# Patient Record
Sex: Male | Born: 1975
Health system: Southern US, Community
[De-identification: ages and names within clinical notes are randomized; demographics above are authoritative.]

## PROBLEM LIST (undated history)

## (undated) DIAGNOSIS — M109 Gout, unspecified: Secondary | ICD-10-CM

## (undated) DIAGNOSIS — I10 Essential (primary) hypertension: Secondary | ICD-10-CM

## (undated) DIAGNOSIS — F419 Anxiety disorder, unspecified: Secondary | ICD-10-CM

## (undated) HISTORY — PX: EYE SURGERY: SHX253

## (undated) HISTORY — DX: Anxiety disorder, unspecified: F41.9

## (undated) HISTORY — DX: Essential (primary) hypertension: I10

---

## 2015-12-22 ENCOUNTER — Ambulatory Visit (INDEPENDENT_AMBULATORY_CARE_PROVIDER_SITE_OTHER): Payer: Managed Care, Other (non HMO)

## 2015-12-22 ENCOUNTER — Ambulatory Visit (INDEPENDENT_AMBULATORY_CARE_PROVIDER_SITE_OTHER): Payer: Managed Care, Other (non HMO) | Admitting: Family Medicine

## 2015-12-22 VITALS — BP 130/80 | HR 101 | Temp 98.0°F | Resp 18 | Ht 69.0 in | Wt 200.0 lb

## 2015-12-22 DIAGNOSIS — M10072 Idiopathic gout, left ankle and foot: Secondary | ICD-10-CM

## 2015-12-22 DIAGNOSIS — F1010 Alcohol abuse, uncomplicated: Secondary | ICD-10-CM

## 2015-12-22 DIAGNOSIS — M79672 Pain in left foot: Secondary | ICD-10-CM | POA: Diagnosis not present

## 2015-12-22 DIAGNOSIS — M109 Gout, unspecified: Secondary | ICD-10-CM

## 2015-12-22 LAB — URIC ACID: Uric Acid, Serum: 9 mg/dL — ABNORMAL HIGH (ref 4.0–7.8)

## 2015-12-22 LAB — POCT SEDIMENTATION RATE: POCT SED RATE: 29 mm/hr — AB (ref 0–22)

## 2015-12-22 LAB — POCT CBC
Granulocyte percent: 62.6 %G (ref 37–80)
HEMATOCRIT: 46.4 % (ref 43.5–53.7)
Hemoglobin: 15.9 g/dL (ref 14.1–18.1)
LYMPH, POC: 2.2 (ref 0.6–3.4)
MCH, POC: 30.1 pg (ref 27–31.2)
MCHC: 34.3 g/dL (ref 31.8–35.4)
MCV: 87.9 fL (ref 80–97)
MID (CBC): 0.2 (ref 0–0.9)
MPV: 8.2 fL (ref 0–99.8)
POC GRANULOCYTE: 4.1 (ref 2–6.9)
POC LYMPH %: 33.8 % (ref 10–50)
POC MID %: 3.6 % (ref 0–12)
Platelet Count, POC: 202 10*3/uL (ref 142–424)
RBC: 5.28 M/uL (ref 4.69–6.13)
RDW, POC: 13.1 %
WBC: 6.6 10*3/uL (ref 4.6–10.2)

## 2015-12-22 LAB — GLUCOSE, POCT (MANUAL RESULT ENTRY): POC GLUCOSE: 106 mg/dL — AB (ref 70–99)

## 2015-12-22 MED ORDER — HYDROCODONE-ACETAMINOPHEN 5-325 MG PO TABS: 1.0000 | ORAL_TABLET | Freq: Four times a day (QID) | ORAL | Status: DC | PRN

## 2015-12-22 MED ORDER — PREDNISONE 20 MG PO TABS
ORAL_TABLET | ORAL | Status: DC
Start: 2015-12-22 — End: 2016-01-12

## 2015-12-22 MED ORDER — COLCHICINE 0.6 MG PO TABS: 0.6000 mg | ORAL_TABLET | Freq: Every day | ORAL | Status: DC

## 2015-12-22 NOTE — Patient Instructions (Signed)
Prednisone in the morning - 3 tabs for 3 days, 2 tabs for 3 days, then 1 tab for 3 days. Today take 2 tabs colchicine and then 1 tab 1 hour later. In the future at the first sign of a gout attack, do this. You may take norco as needed for pain. Reduce your alcohol intake and shellfish intake. I will call you with your lab results. Return as needed.  Gout Gout is an inflammatory arthritis caused by a buildup of uric acid crystals in the joints. Uric acid is a chemical that is normally present in the blood. When the level of uric acid in the blood is too high it can form crystals that deposit in your joints and tissues. This causes joint redness, soreness, and swelling (inflammation). Repeat attacks are common. Over time, uric acid crystals can form into masses (tophi) near a joint, destroying bone and causing disfigurement. Gout is treatable and often preventable. CAUSES  The disease begins with elevated levels of uric acid in the blood. Uric acid is produced by your body when it breaks down a naturally found substance called purines. Certain foods you eat, such as meats and fish, contain high amounts of purines. Causes of an elevated uric acid level include:  Being passed down from parent to child (heredity).  Diseases that cause increased uric acid production (such as obesity, psoriasis, and certain cancers).  Excessive alcohol use.  Diet, especially diets rich in meat and seafood.  Medicines, including certain cancer-fighting medicines (chemotherapy), water pills (diuretics), and aspirin.  Chronic kidney disease. The kidneys are no longer able to remove uric acid well.  Problems with metabolism. Conditions strongly associated with gout include:  Obesity.  High blood pressure.  High cholesterol.  Diabetes. Not everyone with elevated uric acid levels gets gout. It is not understood why some people get gout and others do not. Surgery, joint injury, and eating too much of certain  foods are some of the factors that can lead to gout attacks. SYMPTOMS   An attack of gout comes on quickly. It causes intense pain with redness, swelling, and warmth in a joint.  Fever can occur.  Often, only one joint is involved. Certain joints are more commonly involved:  Base of the big toe.  Knee.  Ankle.  Wrist.  Finger. Without treatment, an attack usually goes away in a few days to weeks. Between attacks, you usually will not have symptoms, which is different from many other forms of arthritis. DIAGNOSIS  Your caregiver will suspect gout based on your symptoms and exam. In some cases, tests may be recommended. The tests may include:  Blood tests.  Urine tests.  X-rays.  Joint fluid exam. This exam requires a needle to remove fluid from the joint (arthrocentesis). Using a microscope, gout is confirmed when uric acid crystals are seen in the joint fluid. TREATMENT  There are two phases to gout treatment: treating the sudden onset (acute) attack and preventing attacks (prophylaxis).  Treatment of an Acute Attack.  Medicines are used. These include anti-inflammatory medicines or steroid medicines.  An injection of steroid medicine into the affected joint is sometimes necessary.  The painful joint is rested. Movement can worsen the arthritis.  You may use warm or cold treatments on painful joints, depending which works best for you.  Treatment to Prevent Attacks.  If you suffer from frequent gout attacks, your caregiver may advise preventive medicine. These medicines are started after the acute attack subsides. These medicines either help your  kidneys eliminate uric acid from your body or decrease your uric acid production. You may need to stay on these medicines for a very long time.  The early phase of treatment with preventive medicine can be associated with an increase in acute gout attacks. For this reason, during the first few months of treatment, your caregiver  may also advise you to take medicines usually used for acute gout treatment. Be sure you understand your caregiver's directions. Your caregiver may make several adjustments to your medicine dose before these medicines are effective.  Discuss dietary treatment with your caregiver or dietitian. Alcohol and drinks high in sugar and fructose and foods such as meat, poultry, and seafood can increase uric acid levels. Your caregiver or dietitian can advise you on drinks and foods that should be limited. HOME CARE INSTRUCTIONS   Do not take aspirin to relieve pain. This raises uric acid levels.  Only take over-the-counter or prescription medicines for pain, discomfort, or fever as directed by your caregiver.  Rest the joint as much as possible. When in bed, keep sheets and blankets off painful areas.  Keep the affected joint raised (elevated).  Apply warm or cold treatments to painful joints. Use of warm or cold treatments depends on which works best for you.  Use crutches if the painful joint is in your leg.  Drink enough fluids to keep your urine clear or pale yellow. This helps your body get rid of uric acid. Limit alcohol, sugary drinks, and fructose drinks.  Follow your dietary instructions. Pay careful attention to the amount of protein you eat. Your daily diet should emphasize fruits, vegetables, whole grains, and fat-free or low-fat milk products. Discuss the use of coffee, vitamin C, and cherries with your caregiver or dietitian. These may be helpful in lowering uric acid levels.  Maintain a healthy body weight. SEEK MEDICAL CARE IF:   You develop diarrhea, vomiting, or any side effects from medicines.  You do not feel better in 24 hours, or you are getting worse. SEEK IMMEDIATE MEDICAL CARE IF:   Your joint becomes suddenly more tender, and you have chills or a fever. MAKE SURE YOU:   Understand these instructions.  Will watch your condition.  Will get help right away if you  are not doing well or get worse.   This information is not intended to replace advice given to you by your health care provider. Make sure you discuss any questions you have with your health care provider.   Document Released: 11/29/2000 Document Revised: 12/23/2014 Document Reviewed: 07/15/2012 Elsevier Interactive Patient Education [employer]2016 Elsevier Inc.

## 2015-12-22 NOTE — Progress Notes (Signed)
[facility] [address], [place] [address] [phone]  Date:  12/22/2015   Name:  Lucas PernaSylvester Morse   DOB:  01/17/1976   MRN:  [id number]  PCP:  No primary care provider on file.    Chief Complaint: Foot Pain   History of Present Illness:  This is a 40 y.o. male who is presenting with left foot pain x 1 week. Pain started after getting off work. He thought it had to do with some new boots he was wearing. He had the next couple days off work. He then went back work and made his pain much worse. He has been out of work for the past 3 days but pain isn't getting better. He is limping and walking on his outer foot. He has tried tylenol but not helping much. Denies fever, chills, paresthesias. He does not have a hx of gout. He doesn't eat much shellfish but does have a shrimp fried rice dish once a week. He drinks a 6 pack beer after work every night.  Review of Systems:  Review of Systems See HPI  There are no active problems to display for this patient.   Prior to Admission medications   Not on File    No Known Allergies  Past Surgical History  Procedure Laterality Date  . Eye surgery      Social History  Substance Use Topics  . Smoking status: Current Every Day Smoker  . Smokeless tobacco: None  . Alcohol Use: 0.0 oz/week    0 Standard drinks or equivalent per week    History reviewed. No pertinent family history.  Medication list has been reviewed and updated.  Physical Examination:  Physical Exam  Constitutional: He is oriented to person, place, and time. He appears well-developed and well-nourished. No distress.  HENT:  Head: Normocephalic and atraumatic.  Right Ear: Hearing normal.  Left Ear: Hearing normal.  Nose: Nose normal.  Eyes: Conjunctivae and lids are normal. Right eye exhibits no discharge. Left eye exhibits no discharge. No scleral icterus.  Cardiovascular: Normal rate, regular rhythm, normal heart sounds and normal  pulses.   No murmur heard. Pulmonary/Chest: Effort normal and breath sounds normal. No respiratory distress. He has no wheezes. He has no rhonchi. He has no rales.  Musculoskeletal:       Right ankle: Normal.       Left ankle: Normal.       Right foot: Normal.       Left foot: There is decreased range of motion (due to pain), tenderness (over MTP joint) and swelling. There is normal capillary refill.       Feet:  Erythema and swelling over left MTP joint. Erythema extends over dorsal foot. Warmth present Pedal pulses intact  Neurological: He is alert and oriented to person, place, and time.  Skin: Skin is warm, dry and intact. No lesion and no rash noted.  Psychiatric: He has a normal mood and affect. His speech is normal and behavior is normal. Thought content normal.    BP 130/80 mmHg  Pulse 101  Temp(Src) 98 F (36.7 C) (Oral)  Resp 18  Ht 5\' 9"  (1.753 m)  Wt 200 lb (90.719 kg)  BMI 29.52 kg/m2  SpO2 98%  Results for orders placed or performed in visit on 12/22/15  POCT CBC  Result Value Ref Range   WBC 6.6 4.6 - 10.2 K/uL   Lymph, poc 2.2 0.6 - 3.4   POC LYMPH PERCENT 33.8  10 - 50 %L   MID (cbc) 0.2 0 - 0.9   POC MID % 3.6 0 - 12 %M   POC Granulocyte 4.1 2 - 6.9   Granulocyte percent 62.6 37 - 80 %G   RBC 5.28 4.69 - 6.13 M/uL   Hemoglobin 15.9 14.1 - 18.1 g/dL   HCT, POC 16.1 09.6 - 53.7 %   MCV 87.9 80 - 97 fL   MCH, POC 30.1 27 - 31.2 pg   MCHC 34.3 31.8 - 35.4 g/dL   RDW, POC 04.5 %   Platelet Count, POC 202 142 - 424 K/uL   MPV 8.2 0 - 99.8 fL  POCT glucose (manual entry)  Result Value Ref Range   POC Glucose 106 (A) 70 - 99 mg/dl   [facility] reading (PRIMARY) by  Dr. Conley Rolls: negative.   Assessment and Plan:  1. Acute gout of left foot, unspecified cause 2. Left foot pain 3. Alcohol abuse Suspect gout. Will treat with prednisone taper and colchicine -- gave refills of colchicine to use in the future if needed. Norco prn pain. CBC wnl. Uric acid and sed rate  pending. Counseled on reducing alcohol intake and shellfish intake. He voiced understanding. Return if symptoms not improving in 1 week or at any time if symptoms worsen. - predniSONE (DELTASONE) 20 MG tablet; Take 3 PO QAM x3days, 2 PO QAM x3days, 1 PO QAM x3days  Dispense: 18 tablet; Refill: 0 - HYDROcodone-acetaminophen (NORCO) 5-325 MG tablet; Take 1 tablet by mouth every 6 (six) hours as needed.  Dispense: 30 tablet; Refill: 0 - colchicine 0.6 MG tablet; Take 1 tablet (0.6 mg total) by mouth daily.  Dispense: 3 tablet; Refill: 5 - DG Foot Complete Left; Future - POCT CBC - Uric Acid - POCT SEDIMENTATION RATE - POCT glucose (manual entry)   [place]well Miners. Dyke Brackett, MHS [facility]  12/22/2015

## 2015-12-25 ENCOUNTER — Telehealth: Payer: Self-pay

## 2015-12-25 DIAGNOSIS — M109 Gout, unspecified: Secondary | ICD-10-CM

## 2015-12-25 MED ORDER — COLCHICINE 0.6 MG PO TABS: ORAL_TABLET | ORAL | Status: DC

## 2015-12-25 NOTE — Telephone Encounter (Signed)
I reviewed Lucas Morse's OV notes and copied her instr's on note's plan onto the sig for Rx, "Today take 2 tabs colchicine and then 1 tab 1 hour later. In the future at the first sign of a gout attack, do this.". Called [facility] and explained what directions [name] had intended and re-sent corrected Rx.

## 2015-12-25 NOTE — Telephone Encounter (Signed)
Pharmacist, Selena called for rx clarification on colchicine 0.6 MG tablet /// please call to advise//Pt is at pharmacy now.  [phone]

## 2015-12-26 ENCOUNTER — Telehealth: Payer: Self-pay

## 2015-12-26 NOTE — Telephone Encounter (Signed)
Pt called inq. About lab results

## 2015-12-27 NOTE — Telephone Encounter (Signed)
See labs 

## 2015-12-27 NOTE — Progress Notes (Signed)
Agree with A/P. Dr Le 

## 2016-01-12 ENCOUNTER — Telehealth: Payer: Self-pay

## 2016-01-12 DIAGNOSIS — M109 Gout, unspecified: Secondary | ICD-10-CM

## 2016-01-12 MED ORDER — COLCHICINE 0.6 MG PO TABS: ORAL_TABLET | ORAL | Status: DC

## 2016-01-12 MED ORDER — PREDNISONE 20 MG PO TABS: ORAL_TABLET | ORAL | Status: DC

## 2016-01-12 NOTE — Telephone Encounter (Signed)
Please call to let him know I refilled colchicine , prednisone. Unable to fill hyrdrocodone since needs to be evaluated for that, he needs physcial prescription for narcotics. Marland Kitchen

## 2016-01-12 NOTE — Telephone Encounter (Signed)
Patient is calling to request refills for all three of his medications. Prednisone, hydrocodone, and colchicine. [facility] on [address] [phone]

## 2016-01-12 NOTE — Telephone Encounter (Signed)
Assessment and Plan:  1. Acute gout of left foot, unspecified cause 2. Left foot pain 3. Alcohol abuse Suspect gout. Will treat with prednisone taper and colchicine -- gave refills of colchicine to use in the future if needed. Norco prn pain. CBC wnl. Uric acid and sed rate pending. Counseled on reducing alcohol intake and shellfish intake. He voiced understanding. Return if symptoms not improving in 1 week or at any time if symptoms worsen.

## 2016-07-20 ENCOUNTER — Ambulatory Visit (HOSPITAL_COMMUNITY)
Admission: EM | Admit: 2016-07-20 | Discharge: 2016-07-20 | Disposition: A | Discharge status: Home / Self Care | Payer: Managed Care, Other (non HMO) | Attending: Emergency Medicine | Admitting: Emergency Medicine

## 2016-07-20 ENCOUNTER — Ambulatory Visit: Payer: Managed Care, Other (non HMO)

## 2016-07-20 ENCOUNTER — Encounter (HOSPITAL_COMMUNITY): Payer: Self-pay | Admitting: *Deleted

## 2016-07-20 DIAGNOSIS — M10071 Idiopathic gout, right ankle and foot: Secondary | ICD-10-CM

## 2016-07-20 DIAGNOSIS — M109 Gout, unspecified: Secondary | ICD-10-CM

## 2016-07-20 HISTORY — DX: Gout, unspecified: M10.9

## 2016-07-20 MED ORDER — PREDNISONE 20 MG PO TABS: ORAL_TABLET | ORAL | 0 refills | Status: DC

## 2016-07-20 MED ORDER — COLCHICINE 0.6 MG PO TABS: ORAL_TABLET | ORAL | 0 refills | Status: DC

## 2016-07-20 MED ORDER — HYDROCODONE-ACETAMINOPHEN 5-325 MG PO TABS: 1.0000 | ORAL_TABLET | Freq: Four times a day (QID) | ORAL | 0 refills | Status: DC | PRN

## 2016-07-20 NOTE — ED Provider Notes (Signed)
CSN: [id number]     Arrival date & time 07/20/16  1200 History   None    Chief Complaint  Patient presents with  . Foot Pain   (Consider location/radiation/quality/duration/timing/severity/associated sxs/prior Treatment)  HPI   The patient is a 40 year old male presents today with complaints of right great toe pain. Patient states onset of symptoms at approximately 2 PM yesterday. Patient states she did not recognize that it was a gout flare until this morning at which time he took 2 tablets of colchicine and then an hour later one additional tablet colchicine. He is now out of colchicine and any other medications to treat this flare. Denies any other significant medical history. Denies any medications or allergies to medications at this time. The patient works as an Librarian, academic in a garage.   Past Medical History:  Diagnosis Date  . Anxiety   . Gout    Past Surgical History:  Procedure Laterality Date  . EYE SURGERY     History reviewed. No pertinent family history. Social History  Substance Use Topics  . Smoking status: Current Every Day Smoker  . Smokeless tobacco: Not on file  . Alcohol use 0.0 oz/week    Review of Systems  Constitutional: Negative.  Negative for fatigue and fever.  HENT: Negative.   Eyes: Negative.   Respiratory: Negative.   Cardiovascular: Negative.   Gastrointestinal: Negative.   Endocrine: Negative.   Genitourinary: Negative.   Musculoskeletal: Negative.   Skin: Positive for color change. Negative for rash.       Swelling R great toe joint/painful.   Allergic/Immunologic: Negative.   Neurological: Negative.   Hematological: Negative.   Psychiatric/Behavioral: Negative.     Allergies  Review of patient's allergies indicates no known allergies.  Home Medications   Prior to Admission medications   Medication Sig Start Date End Date Taking? Authorizing Provider  colchicine 0.6 MG tablet Today take 2 tabs colchicine and then 1 tab 1  hour later. In the future at the first sign of a gout attack, do this. 07/20/16   Servando Salina, NP  HYDROcodone-acetaminophen (NORCO) 5-325 MG tablet Take 1 tablet by mouth every 6 (six) hours as needed for severe pain. 07/20/16   Servando Salina, NP  predniSONE (DELTASONE) 20 MG tablet Take 3 PO QAM x3days, 2 PO QAM x3days, 1 PO QAM x3days 07/20/16   Servando Salina, NP   Meds Ordered and Administered this Visit  Medications - No data to display  BP 154/99 (BP Location: Left Arm)   Pulse 96   Temp 98.4 F (36.9 C) (Oral)   Resp 12   SpO2 99%  No data found.   Physical Exam  Constitutional: He appears well-developed and well-nourished. No distress.  Cardiovascular: Normal rate, regular rhythm, normal heart sounds and intact distal pulses.  Exam reveals no gallop and no friction rub.   No murmur heard. Pulmonary/Chest: Effort normal and breath sounds normal. No respiratory distress. He has no wheezes. He has no rales. He exhibits no tenderness.  Skin: Skin is warm and dry. He is not diaphoretic.  Reddened swollen area - proximal joint of R great toe. Tender to touch.  Mild appreciable heat to touch.  CMS intact.  Dorsalis pedal pulses 2+.  Nursing note and vitals reviewed.   Urgent Care Course   Clinical Course    Procedures (including critical care time)  Labs Review Labs Reviewed - No data to display  Imaging Review No results found.  MDM   1. Acute gout of right foot, unspecified cause   2. Acute gout of left foot, unspecified cause    Meds ordered this encounter  Medications  . colchicine 0.6 MG tablet    Sig: Today take 2 tabs colchicine and then 1 tab 1 hour later. In the future at the first sign of a gout attack, do this.    Dispense:  30 tablet    Refill:  0    Order Specific Question:   Supervising Provider    Answer:   Charm Rings [id number]  . HYDROcodone-acetaminophen (NORCO) 5-325 MG tablet    Sig: Take 1 tablet by mouth every 6 (six) hours as  needed for severe pain.    Dispense:  15 tablet    Refill:  0    Order Specific Question:   Supervising Provider    Answer:   Charm Rings [id number]  . DISCONTD: predniSONE (DELTASONE) 20 MG tablet    Sig: Take 3 PO QAM x3days, 2 PO QAM x3days, 1 PO QAM x3days    Dispense:  18 tablet    Refill:  0    Order Specific Question:   Supervising Provider    Answer:   Charm Rings [id number]  . predniSONE (DELTASONE) 20 MG tablet    Sig: Take 3 PO QAM x3days, 2 PO QAM x3days, 1 PO QAM x3days    Dispense:  18 tablet    Refill:  0    Order Specific Question:   Supervising Provider    Answer:   Charm Rings [[id number]]   The usual and customary discharge instructions and warnings were given.  Gave patient additional information on low prurine diet as requested and patient given a work note for Monday if needed to use at his discretion.  The patient verbalizes understanding and agrees to plan of care.       Servando Salina, NP 07/20/16 1255

## 2016-07-20 NOTE — ED Triage Notes (Signed)
PT  REPORTS  PAIN  R  FOOT  SINCE  YESTERDAY  PT  REPORTS  HAS  A HISTORY OF  GOUT  IN PAST        PT  DENYS  ANY SPECEFIC  INJURY    PT AMBULATED  TO  EXAM ROOM

## 2017-11-16 ENCOUNTER — Other Ambulatory Visit: Payer: Self-pay

## 2017-11-16 ENCOUNTER — Encounter (HOSPITAL_COMMUNITY): Payer: Self-pay | Admitting: *Deleted

## 2017-11-16 ENCOUNTER — Ambulatory Visit (HOSPITAL_COMMUNITY)
Admission: EM | Admit: 2017-11-16 | Discharge: 2017-11-16 | Disposition: A | Discharge status: Home / Self Care | Payer: 59

## 2017-11-16 DIAGNOSIS — M109 Gout, unspecified: Secondary | ICD-10-CM | POA: Diagnosis not present

## 2017-11-16 MED ORDER — COLCHICINE 0.6 MG PO TABS: ORAL_TABLET | ORAL | 0 refills | Status: DC

## 2017-11-16 MED ORDER — PREDNISONE 20 MG PO TABS: ORAL_TABLET | ORAL | 0 refills | Status: DC

## 2017-11-16 NOTE — ED Notes (Signed)
Informed Olivia with pt vitals and she verbalized understanding.

## 2017-11-16 NOTE — ED Provider Notes (Signed)
[facility]    CSN: [id number] Arrival date & time: 11/16/17  1202     History   Chief Complaint Chief Complaint  Patient presents with  . Foot Pain    HPI Lucas Morse is a 41 y.o. male.   41 year-old male, with history of white coat HTN, gout, anxiety, presenting today due to left foot pain. States that [place] from [place] Friday and Friday evening, he notice pain to the MTP joint of his left foot. Has had increased pain, redness and swelling since that time. History of gout and states that this feels the same. No fever or chills He states that colchicine and prednisone help him the most     The history is provided by the patient.  Foot Pain  This is a recurrent problem. The current episode started 2 days ago. The problem occurs constantly. The problem has been gradually worsening. Pertinent negatives include no chest pain, no abdominal pain, no headaches and no shortness of breath. Nothing aggravates the symptoms. Nothing relieves the symptoms. He has tried nothing for the symptoms. The treatment provided no relief.    Past Medical History:  Diagnosis Date  . Anxiety   . Gout     There are no active problems to display for this patient.   Past Surgical History:  Procedure Laterality Date  . EYE SURGERY         Home Medications    Prior to Admission medications   Medication Sig Start Date End Date Taking? Authorizing Provider  naproxen sodium (ALEVE) 220 MG tablet Take 220 mg by mouth.   Yes [provider]  colchicine 0.6 MG tablet Today take 2 tabs colchicine and then 1 tab 1 hour later. In the future at the first sign of a gout attack, do this. 11/16/17   Blue, Olivia C, PA-C  HYDROcodone-acetaminophen (NORCO) 5-325 MG tablet Take 1 tablet by mouth every 6 (six) hours as needed for severe pain. 07/20/16   Servando Salinaossi, Catherine H, NP  predniSONE (DELTASONE) 20 MG tablet Take 3 PO QAM x3days, 2 PO QAM x3days, 1 PO QAM x3days  11/16/17   Blue, Marylene Landlivia C, PA-C    Family History History reviewed. No pertinent family history.  Social History Social History   Tobacco Use  . Smoking status: Current Every Day Smoker  . Smokeless tobacco: Never Used  Substance Use Topics  . Alcohol use: Yes    Alcohol/week: 0.0 oz  . Drug use: No     Allergies   Patient has no known allergies.   Review of Systems Review of Systems  Constitutional: Negative for chills and fever.  HENT: Negative for ear pain and sore throat.   Eyes: Negative for pain and visual disturbance.  Respiratory: Negative for cough and shortness of breath.   Cardiovascular: Negative for chest pain and palpitations.  Gastrointestinal: Negative for abdominal pain and vomiting.  Genitourinary: Negative for dysuria and hematuria.  Musculoskeletal: Positive for arthralgias. Negative for back pain.  Skin: Negative for color change and rash.  Neurological: Negative for seizures, syncope and headaches.  All other systems reviewed and are negative.    Physical Exam Triage Vital Signs ED Triage Vitals  Enc Vitals Group     BP 11/16/17 1228 (!) 184/99     Pulse Rate 11/16/17 1228 (!) 101     Resp --      Temp 11/16/17 1225 98.2 F (36.8 C)     Temp Source 11/16/17  1225 Oral     SpO2 11/16/17 1225 100 %     Weight --      Height --      Head Circumference --      Peak Flow --      Pain Score 11/16/17 1222 6     Pain Loc --      Pain Edu? --      Excl. in GC? --    No data found.  Updated Vital Signs BP (!) 184/99 (BP Location: Right Arm)   Pulse (!) 101   Temp 98.2 F (36.8 C) (Oral)   SpO2 100%   Visual Acuity Right Eye Distance:   Left Eye Distance:   Bilateral Distance:    Right Eye Near:   Left Eye Near:    Bilateral Near:     Physical Exam  Constitutional: He appears well-developed and well-nourished.  HENT:  Head: Normocephalic and atraumatic.  Eyes: Conjunctivae are normal.  Neck: Neck supple.  Cardiovascular:  Normal rate and regular rhythm.  No murmur heard. Pulmonary/Chest: Effort normal and breath sounds normal. No respiratory distress.  Abdominal: Soft. There is no tenderness.  Musculoskeletal: He exhibits no edema.       Left foot: There is tenderness and swelling.       Feet:  Erythema and edema noted to the MTP joint of the left great toe. No red streaking. Normal ROM of the joint. Good pedal pulse and cap refill. Nv intact  Neurological: He is alert.  Skin: Skin is warm and dry.  Psychiatric: He has a normal mood and affect.  Nursing note and vitals reviewed.    UC Treatments / Results  Labs (all labs ordered are listed, but only abnormal results are displayed) Labs Reviewed - No data to display  EKG  EKG Interpretation None       Radiology No results found.  Procedures Procedures (including critical care time)  Medications Ordered in UC Medications - No data to display   Initial Impression / Assessment and Plan / UC Course  I have reviewed the triage vital signs and the nursing notes.  Pertinent labs & imaging results that were available during my care of the patient were reviewed by me and considered in my medical decision making (see chart for details).     Exam and history consistent with gout No evidence of septic arthritis Prednisone and colchicine   Final Clinical Impressions(s) / UC Diagnoses   Final diagnoses:  Acute gout of left foot, unspecified cause    ED Discharge Orders        Ordered    colchicine 0.6 MG tablet     11/16/17 1244    predniSONE (DELTASONE) 20 MG tablet     11/16/17 1244       Controlled Substance Prescriptions [place] Controlled Substance Registry consulted? Not Applicable   Alecia LemmingBlue, Olivia C, [place] 11/16/17 1245

## 2017-11-16 NOTE — ED Triage Notes (Signed)
Per pt his left foot started swelling late Friday night. Per pt he feels like it's gout. Per pt he took Aleve.

## 2018-03-20 ENCOUNTER — Ambulatory Visit (HOSPITAL_COMMUNITY)
Admission: EM | Admit: 2018-03-20 | Discharge: 2018-03-20 | Disposition: A | Discharge status: Home / Self Care | Payer: 59 | Attending: Family Medicine | Admitting: Family Medicine

## 2018-03-20 ENCOUNTER — Encounter (HOSPITAL_COMMUNITY): Payer: Self-pay | Admitting: Emergency Medicine

## 2018-03-20 DIAGNOSIS — M109 Gout, unspecified: Secondary | ICD-10-CM

## 2018-03-20 MED ORDER — PREDNISONE 10 MG PO TABS: 50.0000 mg | ORAL_TABLET | Freq: Every day | ORAL | 0 refills | Status: AC

## 2018-03-20 NOTE — ED Provider Notes (Signed)
[facility]    CSN: [id number] Arrival date & time: 03/20/18  1750     History   Chief Complaint Chief Complaint  Patient presents with  . Foot Pain    HPI Lucas Morse is a 42 y.o. male.   42 yo male with PMH significant for gout presents for left great toe pain x 2 days. He has been taking colchicine but this only helps the pain a Triggs. Nothing makes better or worse. He admits to associated toe swelling and erythema.      Past Medical History:  Diagnosis Date  . Anxiety   . Gout     There are no active problems to display for this patient.   Past Surgical History:  Procedure Laterality Date  . EYE SURGERY         Home Medications    Prior to Admission medications   Medication Sig Start Date End Date Taking? Authorizing Provider  colchicine 0.6 MG tablet Today take 2 tabs colchicine and then 1 tab 1 hour later. In the future at the first sign of a gout attack, do this. 11/16/17   Blue, Olivia C, PA-C  HYDROcodone-acetaminophen (NORCO) 5-325 MG tablet Take 1 tablet by mouth every 6 (six) hours as needed for severe pain. 07/20/16   Servando Salinaossi, Catherine H, NP  naproxen sodium (ALEVE) 220 MG tablet Take 220 mg by mouth.    [provider]  predniSONE (DELTASONE) 20 MG tablet Take 3 PO QAM x3days, 2 PO QAM x3days, 1 PO QAM x3days 11/16/17   Blue, Marylene Landlivia C, PA-C    Family History History reviewed. No pertinent family history.  Social History Social History   Tobacco Use  . Smoking status: Current Every Day Smoker  . Smokeless tobacco: Never Used  Substance Use Topics  . Alcohol use: Yes    Alcohol/week: 0.0 oz  . Drug use: No     Allergies   Patient has no known allergies.   Review of Systems Review of Systems  Constitutional: Negative for activity change and appetite change.  HENT: Negative for congestion and ear pain.   Eyes: Negative for discharge and itching.  Respiratory: Negative for apnea and chest tightness.     Cardiovascular: Negative for chest pain and leg swelling.  Gastrointestinal: Negative for abdominal distention and abdominal pain.  Endocrine: Negative for cold intolerance and heat intolerance.  Genitourinary: Negative for difficulty urinating and dysuria.  Musculoskeletal: Negative for arthralgias and back pain.  Neurological: Negative for dizziness and headaches.  Hematological: Negative for adenopathy. Does not bruise/bleed easily.     Physical Exam Triage Vital Signs ED Triage Vitals [03/20/18 1805]  Enc Vitals Group     BP (!) 166/91     Pulse Rate 88     Resp 18     Temp 98.8 F (37.1 C)     Temp Source Oral     SpO2 100 %     Weight      Height      Head Circumference      Peak Flow      Pain Score      Pain Loc      Pain Edu?      Excl. in GC?    No data found.  Updated Vital Signs BP (!) 166/91 (BP Location: Right Arm)   Pulse 88   Temp 98.8 F (37.1 C) (Oral)   Resp 18   SpO2 100%   Visual Acuity Right Eye Distance:  Left Eye Distance:   Bilateral Distance:    Right Eye Near:   Left Eye Near:    Bilateral Near:     Physical Exam  Constitutional: He is oriented to person, place, and time. He appears well-developed and well-nourished.  HENT:  Head: Normocephalic and atraumatic.  Eyes: Pupils are equal, round, and reactive to light. EOM are normal.  Neck: Normal range of motion. Neck supple.  Pulmonary/Chest: No respiratory distress.  Musculoskeletal: Normal range of motion. He exhibits no edema.  Left great toe: swollen, erythematous, and tender to touch  Neurological: He is alert and oriented to person, place, and time.  Skin: Skin is warm and dry.  Psychiatric: He has a normal mood and affect. His behavior is normal.     UC Treatments / Results  Labs (all labs ordered are listed, but only abnormal results are displayed) Labs Reviewed - No data to display  EKG None Radiology No results found.  Procedures Procedures (including  critical care time)  Medications Ordered in UC Medications - No data to display   Initial Impression / Assessment and Plan / UC Course  I have reviewed the triage vital signs and the nursing notes.  Pertinent labs & imaging results that were available during my care of the patient were reviewed by me and considered in my medical decision making (see chart for details).     1. Gout, left great toe- treat with prednisone. Follow up with PCP.  Final Clinical Impressions(s) / UC Diagnoses   Final diagnoses:  None    ED Discharge Orders    None       Controlled Substance Prescriptions [place] Controlled Substance Registry consulted? No   Rolm Bookbinder, DO 03/20/18 1842

## 2018-03-20 NOTE — ED Triage Notes (Signed)
Pt sts left foot pain from gout with hx of same  

## 2018-04-09 ENCOUNTER — Encounter (HOSPITAL_COMMUNITY): Payer: Self-pay | Admitting: Family Medicine

## 2018-04-09 ENCOUNTER — Ambulatory Visit (HOSPITAL_COMMUNITY)
Admission: EM | Admit: 2018-04-09 | Discharge: 2018-04-09 | Disposition: A | Discharge status: Home / Self Care | Payer: 59 | Attending: Internal Medicine | Admitting: Internal Medicine

## 2018-04-09 DIAGNOSIS — M79674 Pain in right toe(s): Secondary | ICD-10-CM | POA: Diagnosis not present

## 2018-04-09 MED ORDER — PREDNISONE 50 MG PO TABS: 50.0000 mg | ORAL_TABLET | Freq: Every day | ORAL | 0 refills | Status: AC

## 2018-04-09 MED ORDER — NAPROXEN 500 MG PO TABS: 500.0000 mg | ORAL_TABLET | Freq: Two times a day (BID) | ORAL | 0 refills | Status: AC

## 2018-04-09 NOTE — Discharge Instructions (Addendum)
As discussed, left toe pain could be due to gout or inflammation.  Start prednisone and naproxen for symptoms.  You can try changing her shoes to see if that is related to your recurrent pain.  Follow-up with your PCP for further evaluation and management needed.  [place] [facility] is another location you can establish PCP care. [place] and [place] locations may have more availability.

## 2018-04-09 NOTE — ED Triage Notes (Signed)
Pt here for continued right foot pain due to Gout. He was seen and treated here a few weeks ago and not getting any better. He has been taking colchicine and prednisone. He says that he has some pain in the left foot.

## 2018-04-09 NOTE — ED Provider Notes (Signed)
[facility]    CSN: [id number] Arrival date & time: 04/09/18  1707     History   Chief Complaint Chief Complaint  Patient presents with  . Foot Pain    HPI Lucas Morse is a 42 y.o. male.   42 year old male comes in for right great toe pain.  He was seen few weeks ago for gout, was given colchicine and prednisone with some relief.  See symptoms resolved prior to current symptom onset.  Current episode started about 2 weeks ago.  He is having pain, swelling.  Denies fever, chills, night sweats.  Denies injury.  Patient has not noticed any obvious trigger to his gout attacks.  Does not currently have a PCP.      Past Medical History:  Diagnosis Date  . Anxiety   . Gout     There are no active problems to display for this patient.   Past Surgical History:  Procedure Laterality Date  . EYE SURGERY         Home Medications    Prior to Admission medications   Medication Sig Start Date End Date Taking? Authorizing Provider  colchicine 0.6 MG tablet Today take 2 tabs colchicine and then 1 tab 1 hour later. In the future at the first sign of a gout attack, do this. 11/16/17   Blue, Olivia C, PA-C  naproxen (NAPROSYN) 500 MG tablet Take 1 tablet (500 mg total) by mouth 2 (two) times daily for 10 days. 04/09/18 04/19/18  Cathie HoopsYu, Amy V, PA-C  predniSONE (DELTASONE) 50 MG tablet Take 1 tablet (50 mg total) by mouth daily for 5 days. 04/09/18 04/14/18  Belinda FisherYu, Amy V, PA-C    Family History History reviewed. No pertinent family history.  Social History Social History   Tobacco Use  . Smoking status: Current Every Day Smoker  . Smokeless tobacco: Never Used  Substance Use Topics  . Alcohol use: Yes    Alcohol/week: 0.0 oz  . Drug use: No     Allergies   Patient has no known allergies.   Review of Systems Review of Systems  Reason unable to perform ROS: See HPI as above.     Physical Exam Triage Vital Signs ED Triage Vitals  Enc Vitals Group     BP  04/09/18 1752 (!) 153/91     Pulse Rate 04/09/18 1752 (!) 104     Resp 04/09/18 1752 18     Temp 04/09/18 1752 98.1 F (36.7 C)     Temp src --      SpO2 04/09/18 1752 100 %     Weight --      Height --      Head Circumference --      Peak Flow --      Pain Score 04/09/18 1751 8     Pain Loc --      Pain Edu? --      Excl. in GC? --    No data found.  Updated Vital Signs BP (!) 153/91   Pulse (!) 104   Temp 98.1 F (36.7 C)   Resp 18   SpO2 100%   Physical Exam  Constitutional: He is oriented to person, place, and time. He appears well-developed and well-nourished. No distress.  HENT:  Head: Normocephalic and atraumatic.  Eyes: Pupils are equal, round, and reactive to light. Conjunctivae are normal.  Musculoskeletal:  Swelling, erythema to the right MTP joint.  No obvious increased warmth.  Tenderness to light touch.  Full range of motion.  Pedal pulse 2+ and equal bilaterally.  Cap refill less than 2 seconds.  Neurological: He is alert and oriented to person, place, and time.     UC Treatments / Results  Labs (all labs ordered are listed, but only abnormal results are displayed) Labs Reviewed - No data to display  EKG None Radiology No results found.  Procedures Procedures (including critical care time)  Medications Ordered in UC Medications - No data to display   Initial Impression / Assessment and Plan / UC Course  I have reviewed the triage vital signs and the nursing notes.  Pertinent labs & imaging results that were available during my care of the patient were reviewed by me and considered in my medical decision making (see chart for details).     Patient with swelling and erythema to the right MTP joint that is consistent with gout.  However, without increased warmth.  Patient also wearing boots that can cause some irritation to the area.  Will treat for gout with prednisone.  Will also have patient take naproxen for pain.  Patient to switch boots to  evaluate if it could be the source of pain.  Patient to follow-up with PCP given frequent gout attacks for further evaluation and management needed.  Resources provided.  Return precautions given.  Patient expresses understanding and agrees to plan.  Final Clinical Impressions(s) / UC Diagnoses   Final diagnoses:  Great toe pain, right    ED Discharge Orders        Ordered    predniSONE (DELTASONE) 50 MG tablet  Daily     04/09/18 1807    naproxen (NAPROSYN) 500 MG tablet  2 times daily     04/09/18 1807        Belinda Fisher, PA-C 04/09/18 1816

## 2018-05-10 ENCOUNTER — Ambulatory Visit (HOSPITAL_COMMUNITY)
Admission: EM | Admit: 2018-05-10 | Discharge: 2018-05-10 | Disposition: A | Discharge status: Home / Self Care | Payer: 59 | Attending: Family Medicine | Admitting: Family Medicine

## 2018-05-10 ENCOUNTER — Encounter (HOSPITAL_COMMUNITY): Payer: Self-pay | Admitting: Emergency Medicine

## 2018-05-10 DIAGNOSIS — M10072 Idiopathic gout, left ankle and foot: Secondary | ICD-10-CM | POA: Diagnosis not present

## 2018-05-10 DIAGNOSIS — M109 Gout, unspecified: Secondary | ICD-10-CM

## 2018-05-10 MED ORDER — COLCHICINE 0.6 MG PO TABS: ORAL_TABLET | ORAL | 5 refills | Status: DC

## 2018-05-10 MED ORDER — PREDNISONE 10 MG (21) PO TBPK: ORAL_TABLET | Freq: Every day | ORAL | 5 refills | Status: DC

## 2018-05-10 NOTE — ED Provider Notes (Signed)
[facility]   [id number] 05/10/18 Arrival Time: 1206   SUBJECTIVE:  Lucas Morse is a 42 y.o. male who presents to the urgent care with complaint of gout pain in his L foot x 2 weeks.   Past Medical History:  Diagnosis Date  . Anxiety   . Gout    History reviewed. No pertinent family history. Social History   Socioeconomic History  . Marital status: Single    Spouse name: Not on file  . Number of children: Not on file  . Years of education: Not on file  . Highest education level: Not on file  Occupational History  . Not on file  Social Needs  . Financial resource strain: Not on file  . Food insecurity:    Worry: Not on file    Inability: Not on file  . Transportation needs:    Medical: Not on file    Non-medical: Not on file  Tobacco Use  . Smoking status: Current Every Day Smoker  . Smokeless tobacco: Never Used  Substance and Sexual Activity  . Alcohol use: Yes    Alcohol/week: 0.0 oz  . Drug use: No  . Sexual activity: Not on file  Lifestyle  . Physical activity:    Days per week: Not on file    Minutes per session: Not on file  . Stress: Not on file  Relationships  . Social connections:    Talks on phone: Not on file    Gets together: Not on file    Attends religious service: Not on file    Active member of club or organization: Not on file    Attends meetings of clubs or organizations: Not on file    Relationship status: Not on file  . Intimate partner violence:    Fear of current or ex partner: Not on file    Emotionally abused: Not on file    Physically abused: Not on file    Forced sexual activity: Not on file  Other Topics Concern  . Not on file  Social History Narrative  . Not on file   No outpatient medications have been marked as taking for the 05/10/18 encounter [facility] Encounter).   No Known Allergies    ROS: As per HPI, remainder of ROS negative.   OBJECTIVE:   Vitals:   05/10/18 1228  BP: (!) 183/116      General appearance: alert; no distress Eyes: PERRL; EOMI; conjunctiva normal HENT: normocephalic; atraumatic; T oral mucosa normal Neck: supple Extremities: Left great toe MTP joint is swollen and quite tender Skin: warm and dry Neurologic: normal gait; grossly normal Psychological: alert and cooperative; normal mood and affect      Labs:  Results for orders placed or performed in visit on 12/22/15  Uric Acid  Result Value Ref Range   Uric Acid, Serum 9.0 (H) 4.0 - 7.8 mg/dL  POCT CBC  Result Value Ref Range   WBC 6.6 4.6 - 10.2 K/uL   Lymph, poc 2.2 0.6 - 3.4   POC LYMPH PERCENT 33.8 10 - 50 %L   MID (cbc) 0.2 0 - 0.9   POC MID % 3.6 0 - 12 %M   POC Granulocyte 4.1 2 - 6.9   Granulocyte percent 62.6 37 - 80 %G   RBC 5.28 4.69 - 6.13 M/uL   Hemoglobin 15.9 14.1 - 18.1 g/dL   HCT, POC 40.9 81.1 - 53.7 %   MCV 87.9 80 - 97 fL   MCH, POC  30.1 27 - 31.2 pg   MCHC 34.3 31.8 - 35.4 g/dL   RDW, POC 16.1 %   Platelet Count, POC 202 142 - 424 K/uL   MPV 8.2 0 - 99.8 fL  POCT SEDIMENTATION RATE  Result Value Ref Range   POCT SED RATE 29 (A) 0 - 22 mm/hr  POCT glucose (manual entry)  Result Value Ref Range   POC Glucose 106 (A) 70 - 99 mg/dl    Labs Reviewed - No data to display  No results found.     ASSESSMENT & PLAN:  1. Acute idiopathic gout involving toe of left foot   2. Acute gout of left foot, unspecified cause     Meds ordered this encounter  Medications  . colchicine 0.6 MG tablet    Sig: Today take 2 tabs colchicine and then 1 tab 1 hour later. In the future at the first sign of a gout attack, do this.    Dispense:  30 tablet    Refill:  5  . predniSONE (STERAPRED UNI-PAK 21 TAB) 10 MG (21) TBPK tablet    Sig: Take by mouth daily. Take 6 tabs by mouth daily  for 2 days, then 5 tabs for 2 days, then 4 tabs for 2 days, then 3 tabs for 2 days, 2 tabs for 2 days, then 1 tab by mouth daily for 2 days    Dispense:  42 tablet    Refill:  5     Reviewed expectations re: course of current medical issues. Questions answered. Outlined signs and symptoms indicating need for more acute intervention. Patient verbalized understanding. After Visit Summary given.    Procedures:      Elvina Sidle, MD 05/10/18 [phone]

## 2018-05-10 NOTE — ED Triage Notes (Signed)
Pt c/o gout pain in his L foot x2 weeks.

## 2020-01-03 ENCOUNTER — Ambulatory Visit (HOSPITAL_COMMUNITY)
Admission: EM | Admit: 2020-01-03 | Discharge: 2020-01-03 | Disposition: A | Discharge status: Home / Self Care | Payer: Self-pay | Attending: Family Medicine | Admitting: Family Medicine

## 2020-01-03 ENCOUNTER — Encounter (HOSPITAL_COMMUNITY): Payer: Self-pay

## 2020-01-03 ENCOUNTER — Other Ambulatory Visit: Payer: Self-pay

## 2020-01-03 DIAGNOSIS — R030 Elevated blood-pressure reading, without diagnosis of hypertension: Secondary | ICD-10-CM

## 2020-01-03 DIAGNOSIS — M109 Gout, unspecified: Secondary | ICD-10-CM

## 2020-01-03 MED ORDER — COLCHICINE 0.6 MG PO TABS: ORAL_TABLET | ORAL | 0 refills | Status: DC

## 2020-01-03 MED ORDER — PREDNISONE 10 MG (21) PO TBPK: ORAL_TABLET | Freq: Every day | ORAL | 5 refills | Status: DC

## 2020-01-03 NOTE — ED Triage Notes (Signed)
Pt presents with recurrent gout flare up.

## 2020-01-05 NOTE — ED Provider Notes (Signed)
[facility]   [id number] 01/03/20 Arrival Time: 1132  ASSESSMENT & PLAN:  1. Podagra   2. Elevated blood pressure reading without diagnosis of hypertension     No indication for foot imaging. Will treat for gout.  Reports below medications have helped in the past. Keeps colchicine on hand for prn use.  Begin: Meds ordered this encounter  Medications  . predniSONE (STERAPRED UNI-PAK 21 TAB) 10 MG (21) TBPK tablet    Sig: Take by mouth daily. Take 6 tabs by mouth daily  for 2 days, then 5 tabs for 2 days, then 4 tabs for 2 days, then 3 tabs for 2 days, 2 tabs for 2 days, then 1 tab by mouth daily for 2 days    Dispense:  42 tablet    Refill:  5  . colchicine 0.6 MG tablet    Sig: Today take 2 tabs colchicine and then 1 tab 1 hour later. In the future at the first sign of a gout attack, do this.    Dispense:  30 tablet    Refill:  0    Encouraged follow up with PCP or establishment of care with a PCP to recheck BP. Has been told of high readings in the past. May return here to recheck BP at any time. Also recommended several lifestyle modifications (weight loss, dietary sodium restriction, increase physical activity and moderate alcohol consumption).    Recommend: Follow-up Information    Schedule an appointment as soon as possible for a visit  with Primary Care at [facility].   Specialty: Family Medicine Contact information: [address], [address] [place] [address] [phone]       Schedule an appointment as soon as possible for a visit  with [facility].   Contact information: [address] [place] [address] [phone]           Reviewed expectations re: course of current medical issues. Questions answered. Outlined signs and symptoms indicating need for more acute intervention. Patient verbalized understanding. After Visit Summary given.  SUBJECTIVE: History from:  patient. Lucas Morse is a 44 y.o. male who reports persistent marked pain of his left great toe/foot; described as aching; without radiation. Onset: gradual. First noted: few d ago. Injury/trama: no. Symptoms have progressed to a point and plateaued since beginning. Aggravating factors: weight bearing. Alleviating factors: have not been identified. Associated symptoms: none reported. Extremity sensation changes or weakness: none. Self treatment: acetaminophen, with no relief.  History of similar: yes, "feels like gout I've had in the past".  Past Surgical History:  Procedure Laterality Date  . EYE SURGERY      Increased blood pressure noted today. Reports that he has not been treated for hypertension in the past.  He reports no chest pain on exertion, no dyspnea on exertion, no swelling of ankles, no orthostatic dizziness or lightheadedness, no orthopnea or paroxysmal nocturnal dyspnea, no palpitations and no intermittent claudication symptoms.  ROS: As per HPI. All other systems negative.    OBJECTIVE:  Vitals:   01/03/20 1221  BP: (!) 179/118  Pulse: 89  Resp: 18  Temp: 98.6 F (37 C)  TempSrc: Oral  SpO2: 99%    General appearance: alert; no distress HEENT: [place]; AT Neck: supple with FROM Resp: unlabored respirations Extremities: . LLE: tender over L first MTP joint; warm with well perfused appearance; mild swelling and erythema CV: brisk extremity capillary refill of LLE; 2+ DP pulse of LLE.  Skin: warm and dry; no visible rashes Neurologic: gait normal; normal sensation and strength of LLE Psychological: alert and cooperative; normal mood and affect    No Known Allergies  Past Medical History:  Diagnosis Date  . Anxiety   . Gout    Social History   Socioeconomic History  . Marital status: Single    Spouse name: Not on file  . Number of children: Not on file  . Years of education: Not on file  . Highest education level: Not on file  Occupational  History  . Not on file  Tobacco Use  . Smoking status: Current Every Day Smoker  . Smokeless tobacco: Never Used  Substance and Sexual Activity  . Alcohol use: Yes    Alcohol/week: 0.0 standard drinks  . Drug use: No  . Sexual activity: Not on file  Other Topics Concern  . Not on file  Social History Narrative  . Not on file   Social Determinants of Health   Financial Resource Strain:   . Difficulty of Paying Living Expenses: Not on file  Food Insecurity:   . Worried About Charity fundraiser in the Last Year: Not on file  . Ran Out of Food in the Last Year: Not on file  Transportation Needs:   . Lack of Transportation (Medical): Not on file  . Lack of Transportation (Non-Medical): Not on file  Physical Activity:   . Days of Exercise per Week: Not on file  . Minutes of Exercise per Session: Not on file  Stress:   . Feeling of Stress : Not on file  Social Connections:   . Frequency of Communication with Friends and Family: Not on file  . Frequency of Social Gatherings with Friends and Family: Not on file  . Attends Religious Services: Not on file  . Active Member of Clubs or Organizations: Not on file  . Attends Archivist Meetings: Not on file  . Marital Status: Not on file   History reviewed. No pertinent family history. Past Surgical History:  Procedure Laterality Date  . EYE SURGERY        Vanessa Kick, MD 01/05/20 [phone]

## 2020-07-02 ENCOUNTER — Encounter (HOSPITAL_COMMUNITY): Payer: Self-pay

## 2020-07-02 ENCOUNTER — Other Ambulatory Visit: Payer: Self-pay

## 2020-07-02 ENCOUNTER — Ambulatory Visit (HOSPITAL_COMMUNITY)
Admission: EM | Admit: 2020-07-02 | Discharge: 2020-07-02 | Disposition: A | Discharge status: Home / Self Care | Payer: Self-pay | Attending: Urgent Care | Admitting: Urgent Care

## 2020-07-02 DIAGNOSIS — M25562 Pain in left knee: Secondary | ICD-10-CM

## 2020-07-02 DIAGNOSIS — M109 Gout, unspecified: Secondary | ICD-10-CM

## 2020-07-02 MED ORDER — PREDNISONE 20 MG PO TABS: ORAL_TABLET | ORAL | 0 refills | Status: DC

## 2020-07-02 MED ORDER — COLCHICINE 0.6 MG PO TABS: 0.6000 mg | ORAL_TABLET | Freq: Two times a day (BID) | ORAL | 0 refills | Status: DC

## 2020-07-02 NOTE — ED Provider Notes (Signed)
[facility]   MRN: [id number] DOB: [birth year]  Subjective:   Lucas Morse is a 44 y.o. male with pmh of gout presenting for 1 week hx of recurrent left knee pain. Last uric acid level was in 2017, was 9.0.  He had actually gotten refills on a prednisone pack from his last office visit in January.  States that he used this and still has a couple days left but has only helped him a [place] bit.  Admits that he has been eating very healthily, drinking a sixpack of beer 1-2 times per day week.  He eats very [place] fiber and has been eating fried food, red meat, liver.  Denies falls, trauma.  Has continued to work, does a lot of walking, bending, squatting.  Has previously had good success with colchicine as well.  Does not have a PCP.  No current facility-administered medications for this encounter.  Current Outpatient Medications:  .  colchicine 0.6 MG tablet, Today take 2 tabs colchicine and then 1 tab 1 hour later. In the future at the first sign of a gout attack, do this., Disp: 30 tablet, Rfl: 0 .  predniSONE (STERAPRED UNI-PAK 21 TAB) 10 MG (21) TBPK tablet, Take by mouth daily. Take 6 tabs by mouth daily  for 2 days, then 5 tabs for 2 days, then 4 tabs for 2 days, then 3 tabs for 2 days, 2 tabs for 2 days, then 1 tab by mouth daily for 2 days, Disp: 42 tablet, Rfl: 5   No Known Allergies  Past Medical History:  Diagnosis Date  . Anxiety   . Gout      Past Surgical History:  Procedure Laterality Date  . EYE SURGERY      History reviewed. No pertinent family history.  Social History   Tobacco Use  . Smoking status: Current Every Day Smoker  . Smokeless tobacco: Never Used  Substance Use Topics  . Alcohol use: Yes    Alcohol/week: 0.0 standard drinks  . Drug use: No    ROS   Objective:   Vitals: Pulse 100   Temp 98.4 F (36.9 C) (Oral)   Resp (!) 22   SpO2 100%   Physical Exam Constitutional:      General: He is not in acute distress.    Appearance:  Normal appearance. He is well-developed and normal weight. He is not ill-appearing, toxic-appearing or diaphoretic.  HENT:     Head: Normocephalic and atraumatic.     Right Ear: External ear normal.     Left Ear: External ear normal.     Nose: Nose normal.     Mouth/Throat:     Pharynx: Oropharynx is clear.  Eyes:     General: No scleral icterus.       Right eye: No discharge.        Left eye: No discharge.     Extraocular Movements: Extraocular movements intact.     Pupils: Pupils are equal, round, and reactive to light.  Cardiovascular:     Rate and Rhythm: Normal rate.  Pulmonary:     Effort: Pulmonary effort is normal.  Musculoskeletal:     Cervical back: Normal range of motion.     Left knee: Swelling and erythema present. Normal range of motion. Tenderness (superior knee with associated warmth and erythema) present.  Skin:    General: Skin is warm and dry.  Neurological:     Mental Status: He is alert and oriented to person, place, and time.  Comments: Ambulates without assistance albeit with pain in his left knee.  Psychiatric:        Mood and Affect: Mood normal.        Behavior: Behavior normal.        Thought Content: Thought content normal.        Judgment: Judgment normal.     Assessment and Plan :   PDMP not reviewed this encounter.  1. Acute pain of left knee   2. Acute gout of left knee, unspecified cause     Emphasized need for significant dietary modifications, low purine diet.  Recommended holding off on using the previous steroid taper and will have him use prednisone 40 mg x 5 days.  Offered patient colchicine to use with this as well, can use this twice daily.  Denies history of kidney disease.  Recommended patient set up follow-up with a new primary care provider, information given to them.   Wallis Bamberg, PA-C 07/02/20 1457

## 2020-07-02 NOTE — ED Triage Notes (Addendum)
Pt presents with left knee pain x 1 week. Worsens when bending the knee or walking for a extended period of time.  States he always get 3 medications for the knee pain and last time they prescribed just 1 and did not helped with the pain.

## 2020-07-19 ENCOUNTER — Ambulatory Visit (HOSPITAL_COMMUNITY)
Admission: EM | Admit: 2020-07-19 | Discharge: 2020-07-19 | Disposition: A | Discharge status: Home / Self Care | Payer: Self-pay | Attending: Family Medicine | Admitting: Family Medicine

## 2020-07-19 ENCOUNTER — Encounter (HOSPITAL_COMMUNITY): Payer: Self-pay | Admitting: Emergency Medicine

## 2020-07-19 ENCOUNTER — Other Ambulatory Visit: Payer: Self-pay

## 2020-07-19 DIAGNOSIS — M109 Gout, unspecified: Secondary | ICD-10-CM

## 2020-07-19 DIAGNOSIS — R030 Elevated blood-pressure reading, without diagnosis of hypertension: Secondary | ICD-10-CM

## 2020-07-19 MED ORDER — COLCHICINE 0.6 MG PO TABS: ORAL_TABLET | ORAL | 0 refills | Status: DC

## 2020-07-19 NOTE — Discharge Instructions (Signed)
Your blood pressure was noted to be elevated during your visit today. If you are currently taking medication for high blood pressure, please ensure you are taking this as directed. If you do not have a history of high blood pressure and your blood pressure remains persistently elevated, you may need to begin taking a medication at some point. You may return here within the next few days to recheck if unable to see your primary care provider or if do not have a one.  BP (!) 184/104 (BP Location: Left Arm)   Pulse 98   Temp 98.4 F (36.9 C) (Oral)   Resp 16   SpO2 99%

## 2020-07-19 NOTE — ED Triage Notes (Signed)
Pt presents with right foot pain. States he has a hx of gout. Was seen 07/02/20 at [facility].

## 2020-07-20 ENCOUNTER — Telehealth: Payer: Self-pay

## 2020-07-20 ENCOUNTER — Telehealth (HOSPITAL_COMMUNITY): Payer: Self-pay | Admitting: Family Medicine

## 2020-07-20 MED ORDER — PREDNISONE 10 MG (21) PO TBPK
ORAL_TABLET | Freq: Every day | ORAL | 0 refills | Status: DC
Start: 2020-07-20 — End: 2020-08-03

## 2020-07-20 NOTE — Telephone Encounter (Signed)
Pt requested prednisone be send to the pharmacy, as Dr, Tracie Harrier told him if his symptoms  do not improved.   Dr. Tracie Harrier sent the prescription to the pharmacy

## 2020-07-20 NOTE — ED Provider Notes (Signed)
[facility]   [id number] 07/19/20 Arrival Time: 1424  ASSESSMENT & PLAN:  1. Podagra   2. Elevated blood pressure reading without diagnosis of hypertension       Discharge Instructions     Your blood pressure was noted to be elevated during your visit today. If you are currently taking medication for high blood pressure, please ensure you are taking this as directed. If you do not have a history of high blood pressure and your blood pressure remains persistently elevated, you may need to begin taking a medication at some point. You may return here within the next few days to recheck if unable to see your primary care provider or if do not have a one.  BP (!) 184/104 (BP Location: Left Arm)   Pulse 98   Temp 98.4 F (36.9 C) (Oral)   Resp 16   SpO2 99%        Begin: Meds ordered this encounter  Medications  . colchicine 0.6 MG tablet    Sig: Take two tablets as one dose followed one hour later by one tablet.    Dispense:  30 tablet    Refill:  0      Reviewed expectations re: course of current medical issues. Questions answered. Outlined signs and symptoms indicating need for more acute intervention. Patient verbalized understanding. After Visit Summary given.  SUBJECTIVE: History from: patient. Lucas Morse is a 44 y.o. male who reports R great toe pain; h/o gout; feels same. No trauma. Pain few days. Able to bear weight but with pain. No recent illnesses.  Increased blood pressure noted today. Reports that he has not been treated for hypertension in the past.  He reports no chest pain on exertion, no dyspnea on exertion, no swelling of ankles, no orthostatic dizziness or lightheadedness, no orthopnea or paroxysmal nocturnal dyspnea, no palpitations and no intermittent claudication symptoms.   Past Surgical History:  Procedure Laterality Date  . EYE SURGERY        OBJECTIVE:  Vitals:   07/19/20 1547 07/19/20 1624  BP: (!) 224/118 (!)  184/104  Pulse: 98   Resp: 16   Temp: 98.4 F (36.9 C)   TempSrc: Oral   SpO2: 99%     General appearance: alert; no distress HEENT: [place]; AT Neck: supple with FROM Resp: unlabored respirations Extremities: . RLE: pain over first MTP joint; mild swelling Skin: warm and dry; no visible rashes Neurologic: gait normal Psychological: alert and cooperative; normal mood and affect    No Known Allergies  Past Medical History:  Diagnosis Date  . Anxiety   . Gout    Social History   Socioeconomic History  . Marital status: Single    Spouse name: Not on file  . Number of children: Not on file  . Years of education: Not on file  . Highest education level: Not on file  Occupational History  . Not on file  Tobacco Use  . Smoking status: Current Every Day Smoker  . Smokeless tobacco: Never Used  Substance and Sexual Activity  . Alcohol use: Yes    Alcohol/week: 0.0 standard drinks  . Drug use: No  . Sexual activity: Not on file  Other Topics Concern  . Not on file  Social History Narrative  . Not on file   Social Determinants of Health   Financial Resource Strain:   . Difficulty of Paying Living Expenses:   Food Insecurity:   . Worried About Programme researcher, broadcasting/film/video in  the Last Year:   . Ran Out of Food in the Last Year:   Transportation Needs:   . Freight forwarder (Medical):   Marland Kitchen Lack of Transportation (Non-Medical):   Physical Activity:   . Days of Exercise per Week:   . Minutes of Exercise per Session:   Stress:   . Feeling of Stress :   Social Connections:   . Frequency of Communication with Friends and Family:   . Frequency of Social Gatherings with Friends and Family:   . Attends Religious Services:   . Active Member of Clubs or Organizations:   . Attends Banker Meetings:   Marland Kitchen Marital Status:    History reviewed. No pertinent family history. Past Surgical History:  Procedure Laterality Date  . EYE SURGERY        Mardella Layman,  MD 07/20/20 [phone]

## 2020-07-20 NOTE — Telephone Encounter (Signed)
Requesting prednisone to help with dx gout flare. Sent to his pharmacy.

## 2020-08-03 ENCOUNTER — Encounter (HOSPITAL_COMMUNITY): Payer: Self-pay

## 2020-08-03 ENCOUNTER — Ambulatory Visit (HOSPITAL_COMMUNITY)
Admission: EM | Admit: 2020-08-03 | Discharge: 2020-08-03 | Disposition: A | Discharge status: Home / Self Care | Payer: Self-pay | Attending: Urgent Care | Admitting: Urgent Care

## 2020-08-03 ENCOUNTER — Other Ambulatory Visit: Payer: Self-pay

## 2020-08-03 DIAGNOSIS — I10 Essential (primary) hypertension: Secondary | ICD-10-CM

## 2020-08-03 DIAGNOSIS — M79672 Pain in left foot: Secondary | ICD-10-CM

## 2020-08-03 DIAGNOSIS — M109 Gout, unspecified: Secondary | ICD-10-CM

## 2020-08-03 MED ORDER — AMLODIPINE BESYLATE 5 MG PO TABS: 5.0000 mg | ORAL_TABLET | Freq: Every day | ORAL | 0 refills | Status: DC

## 2020-08-03 MED ORDER — PREDNISONE 20 MG PO TABS: ORAL_TABLET | ORAL | 0 refills | Status: DC

## 2020-08-03 NOTE — Discharge Instructions (Signed)
For your blood pressure start taking one tablet of amlodipine 5mg  once daily. After 1 week, start taking 10mg  (two tablets) daily. Follow up with [facility] to establish care and set up a new PCP.

## 2020-08-03 NOTE — ED Provider Notes (Addendum)
[facility]   MRN: [id number] DOB: 17-Oct-1976  Subjective:   Lucas Morse is a 44 y.o. male presenting for 2-3 day hx of acute onset recurrent gout of his left great toe. Patient has tried to make dietary modifications. He just moved here and therefore does not have a PCP. He used to take bp meds but does not have any now since moving here.   No current facility-administered medications for this encounter.  Current Outpatient Medications:  .  colchicine 0.6 MG tablet, Take two tablets as one dose followed one hour later by one tablet., Disp: 30 tablet, Rfl: 0   No Known Allergies  Past Medical History:  Diagnosis Date  . Anxiety   . Gout      Past Surgical History:  Procedure Laterality Date  . EYE SURGERY      History reviewed. No pertinent family history.  Social History   Tobacco Use  . Smoking status: Current Every Day Smoker  . Smokeless tobacco: Never Used  Substance Use Topics  . Alcohol use: Yes    Alcohol/week: 0.0 standard drinks  . Drug use: No    ROS Denies cp, ha, weakness, belly pain, hematuria, numbness or tingling.   Objective:   Vitals: BP (!) 212/133   Pulse (!) 102   Temp 98.2 F (36.8 C)   Resp 16   SpO2 100%   Wt Readings from Last 3 Encounters:  12/22/15 200 lb (90.7 kg)   Temp Readings from Last 3 Encounters:  08/03/20 98.2 F (36.8 C)  07/19/20 98.4 F (36.9 C) (Oral)  07/02/20 98.4 F (36.9 C) (Oral)   BP Readings from Last 3 Encounters:  08/03/20 (!) 212/133  07/19/20 (!) 184/104  07/02/20 (!) 169/89   Pulse Readings from Last 3 Encounters:  08/03/20 (!) 102  07/19/20 98  07/02/20 100   BP recheck was 181/134.  Physical Exam Constitutional:      General: He is not in acute distress.    Appearance: Normal appearance. He is well-developed. He is not ill-appearing, toxic-appearing or diaphoretic.  HENT:     Head: Normocephalic and atraumatic.     Right Ear: External ear normal.     Left Ear:  External ear normal.     Nose: Nose normal.     Mouth/Throat:     Mouth: Mucous membranes are moist.     Pharynx: Oropharynx is clear.  Eyes:     General: No scleral icterus.    Extraocular Movements: Extraocular movements intact.     Pupils: Pupils are equal, round, and reactive to light.  Cardiovascular:     Rate and Rhythm: Normal rate and regular rhythm.     Heart sounds: Normal heart sounds. No murmur heard.  No friction rub. No gallop.   Pulmonary:     Effort: Pulmonary effort is normal. No respiratory distress.     Breath sounds: Normal breath sounds. No stridor. No wheezing, rhonchi or rales.  Musculoskeletal:     Right lower leg: No edema.     Left lower leg: No edema.     Comments: Exquisite tenderness with erythema of left 1st MTP.   Skin:    General: Skin is warm and dry.  Neurological:     Mental Status: He is alert and oriented to person, place, and time.     Cranial Nerves: No cranial nerve deficit.     Motor: No weakness.     Coordination: Coordination normal.     Gait: Gait  normal.     Deep Tendon Reflexes: Reflexes normal.     Comments: Negative Romberg and pronator drift.   Psychiatric:        Mood and Affect: Mood normal.        Behavior: Behavior normal.        Thought Content: Thought content normal.        Judgment: Judgment normal.     Assessment and Plan :   PDMP not reviewed this encounter.  1. Acute gout involving toe of left foot, unspecified cause   2. Foot pain, left   3. Essential hypertension   4. Elevated blood pressure reading in office with diagnosis of hypertension     Severely elevated bp, needs to start amlodipine, dosing instructions reviewed. Contact [facility] for establishing care with a new PCP. Start prednisone for gout flare. Use colchicine again thereafter as needed. Counseled patient on potential for adverse effects with medications prescribed/recommended today, strict ER and return-to-clinic precautions discussed,  patient verbalized understanding.   Wallis Bamberg, PA-C 08/03/20 1439

## 2020-08-03 NOTE — ED Triage Notes (Signed)
Pt c/o left big toe pain, hx of gout

## 2020-08-14 ENCOUNTER — Other Ambulatory Visit: Payer: Self-pay

## 2020-08-14 ENCOUNTER — Encounter: Payer: Self-pay | Admitting: Student

## 2020-08-14 ENCOUNTER — Ambulatory Visit (INDEPENDENT_AMBULATORY_CARE_PROVIDER_SITE_OTHER): Payer: Self-pay | Admitting: Student

## 2020-08-14 DIAGNOSIS — I10 Essential (primary) hypertension: Secondary | ICD-10-CM

## 2020-08-14 DIAGNOSIS — Z0000 Encounter for general adult medical examination without abnormal findings: Secondary | ICD-10-CM | POA: Insufficient documentation

## 2020-08-14 DIAGNOSIS — M109 Gout, unspecified: Secondary | ICD-10-CM

## 2020-08-14 DIAGNOSIS — Z0001 Encounter for general adult medical examination with abnormal findings: Secondary | ICD-10-CM

## 2020-08-14 MED ORDER — ALLOPURINOL 100 MG PO TABS: 100.0000 mg | ORAL_TABLET | Freq: Every day | ORAL | 2 refills | Status: DC

## 2020-08-14 MED ORDER — AMLODIPINE BESYLATE 10 MG PO TABS: 10.0000 mg | ORAL_TABLET | Freq: Every day | ORAL | 11 refills | Status: DC

## 2020-08-14 MED ORDER — LOSARTAN POTASSIUM 25 MG PO TABS: 25.0000 mg | ORAL_TABLET | Freq: Every day | ORAL | 11 refills | Status: DC

## 2020-08-14 NOTE — Assessment & Plan Note (Addendum)
History of hypertension. Recently moved and was without BP meds. At recent urgent care visit, BP found to be 212/133 however patient was asymptomatic. Started on amlodipine 5mg  daily.  Reports that he was taking amlodipine 5mg  for one week and then increased to 5mg  BID as of last Friday. BP today 176/86 so will require additional antihypertensive medication. Patient asymptomatic at this time. Advised to incorporate DASH diet and exercise into daily life, which patient agrees with.   He does reports smoking cigarettes (10 pack-year history), but states that he is trying to cut down. Now smoking half-a-pack per day. Counseled on smoking cessation but patient states that he will try to keep cutting down on it. Not amenable to completely stopping at this time.   Will start him on losartan 25mg  for further BP control.  Plan: -Continue amlodipine 10mg  daily -Start losartan 25mg  daily -DASH diet and exercise -Continue smoking cessation counseling -Avoid thiazides due to history of gout

## 2020-08-14 NOTE — Assessment & Plan Note (Addendum)
Patient with history of gout on colchicine 0.6mg  twice daily. Recent ED visit for acute gout flare of L great toe. Given 10-day course of prednisone 40mg  (with last dose on 08/13/20). Stopped taking colchicine because it was not helping and was causing diarrhea.  He reports that gout flares are typically at base of L great toe with one isolated episode a year ago in R great toe. Has gone to Urgent Care 5 times this past year due to gout flares. Reports that he has been trying to improve his diet but does still eat red meat at times. Does report drinking about 6 beers/week. Counseled on improving diet and minimizing alcohol intake to help reduce gout flares.  He reports that he only has relief with prednisone 20mg  tablets for gout flares. Advised him that long-term steroid therapy has many adverse effects so it is not recommended for chronic therapy. Advised him to call clinic if he has acute gout flare so that we can help manage it.  Will start Allopurinol 100mg  daily at this time. Advised to take OTC Aleve BID for the first couple of weeks while starting allopurinol to help reduce gout flares during this time. Patient refusing blood draw at this time so unable to check uric acid level and BMP for renal function at this time. Sent home with pamphlet with low purine diet.  Plan: -discontinued colchicine -Start allopurinol 100mg  daily -take naproxen BID for first couple of weeks of allopurinol initiation -Check uric acid and BMP at next visit -Optimize diet and minimize alcohol intake -to meet with financial counselor for orange card

## 2020-08-14 NOTE — Assessment & Plan Note (Signed)
Patient requires Hep C screening, HIV screening, and Tetanus/Tdap vaccine. However, patient is uninsured at this time so will wait until next visit. Scheduled to see financial counselor to obtain orange card.  He reports having obtained his first dose of the COVID vaccine last week and is scheduled for his second dose in two weeks.

## 2020-08-14 NOTE — Progress Notes (Signed)
   CC: Establish PCP  HPI:  [name] is a 44 y.o. Male with history of gout and essential hypertension who presented to the clinic to establish PCP and for management of gout and HTN. Please see individualized A/P for further HPI.  Past Medical History:  Diagnosis Date  . Anxiety   . Gout    Medications: - Amlodipine 10mg  daily - Allopurinol 100mg  daily - Losartan 25mg  daily  Allergies: NKDA  Past Surgical History: Right eye surgery when he was a kid (reason not known).  Family History: - Mother: deceased d/t HIV/AIDS (as per patient) - Maternal Uncle - Gout - paternal grandmother - stomach cancer  Social History: - Drinks alcohol (about 6 beers/week) - Smokes cigarettes (10 pack-year history) --> has cut down to half-a-pack/day - No recreational drug use - Works as man   Review of Systems:  Negative ROS aside from that listed in individualized A/P.  Physical Exam:  Vitals:   08/14/20 1451  BP: (!) 176/86  Pulse: 99  SpO2: 100%  Weight: 217 lb 14.4 oz (98.8 kg)   Physical Exam Constitutional:      Appearance: Normal appearance.  HENT:     Head: Normocephalic and atraumatic.     Mouth/Throat:     Mouth: Mucous membranes are moist.     Pharynx: Oropharynx is clear. No oropharyngeal exudate.  Eyes:     Extraocular Movements: Extraocular movements intact.     Conjunctiva/sclera: Conjunctivae normal.     Pupils: Pupils are equal, round, and reactive to light.  Cardiovascular:     Rate and Rhythm: Normal rate and regular rhythm.     Heart sounds: Normal heart sounds. No murmur heard.  No friction rub. No gallop.   Pulmonary:     Effort: Pulmonary effort is normal.     Breath sounds: No wheezing, rhonchi or rales.  Abdominal:     General: Bowel sounds are normal. There is no distension.     Palpations: Abdomen is soft.     Tenderness: There is no abdominal tenderness. There is no guarding or rebound.  Musculoskeletal:         General: No swelling. Normal range of motion.     Comments: Mild discomfort at base of left great toe at usual site of gout flares. No redness or erythema.  Skin:    General: Skin is warm and dry.  Neurological:     General: No focal deficit present.     Mental Status: He is alert and oriented to person, place, and time.  Psychiatric:        Mood and Affect: Mood normal.        Behavior: Behavior normal.        Thought Content: Thought content normal.     Assessment & Plan:   See Encounters Tab for problem based charting.  Patient seen with Dr. 

## 2020-08-14 NOTE — Patient Instructions (Addendum)
Lucas Morse,  As we discussed, we will be starting you on a new medication for your gout. The medication is called Allopurinol. Please take one tablet daily. We also want you to take the Aleve twice a day (one in the morning and one at night) for the next two weeks while you start the Allopurinol.   If you have an flare-up of your gout, please make sure to call us so that we can help treat it.  For your high blood pressure, we want you to continue taking the Amlodipine 10mg  daily. We are also starting you on Losartan 25mg  to better control your high blood pressure.  Please make sure to meet our financial counselor to help you get a temporary orange card to help with medical expenses.   Low-Purine Eating Plan A low-purine eating plan involves making food choices to limit your intake of purine. Purine is a kind of uric acid. Too much uric acid in your blood can cause certain conditions, such as gout and kidney stones. Eating a low-purine diet can help control these conditions. What are tips for following this plan? Reading food labels   Avoid foods with saturated or Trans fat.  Check the ingredient list of grains-based foods, such as bread and cereal, to make sure that they contain whole grains.  Check the ingredient list of sauces or soups to make sure they do not contain meat or fish.  When choosing soft drinks, check the ingredient list to make sure they do not contain high-fructose corn syrup. Shopping  Buy plenty of fresh fruits and vegetables.  Avoid buying canned or fresh fish.  Buy dairy products labeled as low-fat or nonfat.  Avoid buying premade or processed foods. These foods are often high in fat, salt (sodium), and added sugar. Cooking  Use olive oil instead of butter when cooking. Oils like olive oil, canola oil, and sunflower oil contain healthy fats. Meal planning  Learn which foods do or do not affect you. If you find out that a food tends to cause your gout  symptoms to flare up, avoid eating that food. You can enjoy foods that do not cause problems. If you have any questions about a food item, talk with your dietitian or health care provider.  Limit foods high in fat, especially saturated fat. Fat makes it harder for your body to get rid of uric acid.  Choose foods that are lower in fat and are lean sources of protein. General guidelines  Limit alcohol intake to no more than 1 drink a day for nonpregnant women and 2 drinks a day for men. One drink equals 12 oz of beer, 5 oz of wine, or 1 oz of hard liquor. Alcohol can affect the way your body gets rid of uric acid.  Drink plenty of water to keep your urine clear or pale yellow. Fluids can help remove uric acid from your body.  If directed by your health care provider, take a vitamin C supplement.  Work with your health care provider and dietitian to develop a plan to achieve or maintain a healthy weight. Losing weight can help reduce uric acid in your blood. What foods are recommended? The items listed may not be a complete list. Talk with your dietitian about what dietary choices are best for you. Foods low in purines Foods low in purines do not need to be limited. These include:  All fruits.  All low-purine vegetables, pickles, and olives.  Breads, pasta, rice, cornbread, and popcorn.  Cake and other baked goods.  All dairy foods.  Eggs, nuts, and nut butters.  Spices and condiments, such as salt, herbs, and vinegar.  Plant oils, butter, and margarine.  Water, sugar-free soft drinks, tea, coffee, and cocoa.  Vegetable-based soups, broths, sauces, and gravies. Foods moderate in purines Foods moderate in purines should be limited to the amounts listed.   cup of asparagus, cauliflower, spinach, mushrooms, or green peas, each day.  2/3 cup uncooked oatmeal, each day.   cup dry wheat bran or wheat germ, each day.  2-3 ounces of meat or poultry, each day.  4-6 ounces of  shellfish, such as crab, lobster, oysters, or shrimp, each day.  1 cup cooked beans, peas, or lentils, each day.  Soup, broths, or bouillon made from meat or fish. Limit these foods as much as possible. What foods are not recommended? The items listed may not be a complete list. Talk with your dietitian about what dietary choices are best for you. Limit your intake of foods high in purines, including:  Beer and other alcohol.  Meat-based gravy or sauce.  Canned or fresh fish, such as: ? Anchovies, sardines, herring, and tuna. ? Mussels and scallops. ? Codfish, trout, and haddock.  Tomasa Blase.  Organ meats, such as: ? Liver or kidney. ? Tripe. ? Sweetbreads (thymus gland or pancreas).  Wild Education officer, environmental.  Yeast or yeast extract supplements.  Drinks sweetened with high-fructose corn syrup. Summary  Eating a low-purine diet can help control conditions caused by too much uric acid in the body, such as gout or kidney stones.  Choose low-purine foods, limit alcohol, and limit foods high in fat.  You will learn over time which foods do or do not affect you. If you find out that a food tends to cause your gout symptoms to flare up, avoid eating that food. This information is not intended to replace advice given to you by your health care provider. Make sure you discuss any questions you have with your health care provider. Document Revised: 11/14/2017 Document Reviewed: 01/15/2017 Elsevier Patient Education  2020 [employer].

## 2020-08-15 NOTE — Progress Notes (Signed)
Internal Medicine Clinic Attending  I saw and evaluated the patient.  I personally confirmed the key portions of the history and exam documented by Dr. Jinwala and I reviewed pertinent patient test results.  The assessment, diagnosis, and plan were formulated together and I agree with the documentation in the resident's note.  Jonie Burdell, M.D., Ph.D.  

## 2020-08-30 ENCOUNTER — Ambulatory Visit: Payer: Self-pay

## 2020-09-07 ENCOUNTER — Other Ambulatory Visit: Payer: Self-pay

## 2020-09-07 NOTE — Telephone Encounter (Signed)
Requesting prednisone to be sent to [facility] on [address].

## 2020-09-07 NOTE — Telephone Encounter (Signed)
Lucas Morse, saw Dr. Austin Miles on 08/14/20 for Gout flare-up and to establish care.  Patient states MD told him if he had a bad flare to call the clinic and he would send prednisone in for him.  Pt was placed on Allopurinol, which he states is helping, but c/o of a flare starting and states "if I don't get some prednisone, I will have to go to the ED.  I am about to go to work at 2pm today and I have an appt with this clinic on Monday.  I need some help and to get some prednisone". Will forward to red team for refill consideration and to advise. SChaplin, RN,BSN

## 2020-09-07 NOTE — Telephone Encounter (Signed)
TC to patient and he was informed of Dr. Vernell Barrier recommendations below.  Pt states "that other Dr told me I could get prednisone called in anytime and he lied to me.  That's not right.  I will be there on Monday and talk to this Dr about it". RN educated patient about prednisone and MD needing labwork to be drawn at [facility] appt, he verbalized understanding. SChaplin, RN,BSN

## 2020-09-07 NOTE — Telephone Encounter (Signed)
Does not appear that he is established at [facility]. Is that what his appointment on Monday is for? He needs to be seen prior to [place] prescribing prednisone as we could be mistreating another condition.  It does not appear that he has had any labs drawn since 2017. Please make him aware that I will expect him to allow labs to be drawn if he is to establish care with [place] because there are certain things that we need to monitor for patients on his current medications.

## 2020-09-08 ENCOUNTER — Encounter (HOSPITAL_COMMUNITY): Payer: Self-pay

## 2020-09-08 ENCOUNTER — Other Ambulatory Visit: Payer: Self-pay

## 2020-09-08 ENCOUNTER — Ambulatory Visit (HOSPITAL_COMMUNITY)
Admission: EM | Admit: 2020-09-08 | Discharge: 2020-09-08 | Disposition: A | Discharge status: Home / Self Care | Payer: Self-pay | Attending: Family Medicine | Admitting: Family Medicine

## 2020-09-08 DIAGNOSIS — M109 Gout, unspecified: Secondary | ICD-10-CM

## 2020-09-08 MED ORDER — PREDNISONE 10 MG (21) PO TBPK: ORAL_TABLET | ORAL | 0 refills | Status: DC

## 2020-09-08 NOTE — Discharge Instructions (Addendum)
Take the prednisone as prescribed Follow up with your doctor on Monday.

## 2020-09-08 NOTE — ED Provider Notes (Signed)
[facility]    CSN: [id number] Arrival date & time: 09/08/20  1057      History   Chief Complaint Chief Complaint  Patient presents with  . Gout    right foot    HPI Lucas Morse is a 44 y.o. male.   Patient is a 44 year old male presents today for gout flare.  He has chronic gout.  This episode started yesterday.  Right great toe pain, swelling.  Has been taking allopurinol.  No injuries to the foot.     Past Medical History:  Diagnosis Date  . Essential hypertension   . Gout     Patient Active Problem List   Diagnosis Date Noted  . Gout 08/14/2020  . Essential hypertension 08/14/2020  . Healthcare maintenance 08/14/2020    Past Surgical History:  Procedure Laterality Date  . EYE SURGERY         Home Medications    Prior to Admission medications   Medication Sig Start Date End Date Taking? Authorizing Provider  allopurinol (ZYLOPRIM) 100 MG tablet Take 1 tablet (100 mg total) by mouth daily. 08/14/20 08/14/21  Merrilyn Puma, MD  amLODipine (NORVASC) 10 MG tablet Take 1 tablet (10 mg total) by mouth daily. 08/14/20 08/14/21  Merrilyn Puma, MD  losartan (COZAAR) 25 MG tablet Take 1 tablet (25 mg total) by mouth daily. 08/14/20 08/14/21  Merrilyn Puma, MD  predniSONE (STERAPRED UNI-PAK 21 TAB) 10 MG (21) TBPK tablet 6 tabs for 1 day, then 5 tabs for 1 das, then 4 tabs for 1 day, then 3 tabs for 1 day, 2 tabs for 1 day, then 1 tab for 1 day 09/08/20   Janace Aris, NP    Family History Family History  Problem Relation Age of Onset  . HIV/AIDS Mother   . Stomach cancer Paternal Grandmother   . Gout Maternal Uncle     Social History Social History   Tobacco Use  . Smoking status: Current Every Day Smoker    Packs/day: 1.00    Types: Cigarettes  . Smokeless tobacco: Never Used  . Tobacco comment: cutting back  Substance Use Topics  . Alcohol use: Yes    Alcohol/week: 0.0 standard drinks  . Drug use: No     Allergies   Patient has  no known allergies.   Review of Systems Review of Systems   Physical Exam Triage Vital Signs ED Triage Vitals  Enc Vitals Group     BP 09/08/20 1251 (!) 190/109     Pulse Rate 09/08/20 1251 98     Resp 09/08/20 1251 16     Temp 09/08/20 1251 98.2 F (36.8 C)     Temp Source 09/08/20 1251 Oral     SpO2 09/08/20 1251 100 %     Weight --      Height --      Head Circumference --      Peak Flow --      Pain Score 09/08/20 1250 10     Pain Loc --      Pain Edu? --      Excl. in GC? --    No data found.  Updated Vital Signs BP (!) 190/109 (BP Location: Right Arm)   Pulse 98   Temp 98.2 F (36.8 C) (Oral)   Resp 16   SpO2 100%   Visual Acuity Right Eye Distance:   Left Eye Distance:   Bilateral Distance:    Right Eye Near:   Left Eye  Near:    Bilateral Near:     Physical Exam Vitals and nursing note reviewed.  Constitutional:      Appearance: Normal appearance.  HENT:     Head: Normocephalic and atraumatic.     Nose: Nose normal.  Eyes:     Conjunctiva/sclera: Conjunctivae normal.  Pulmonary:     Effort: Pulmonary effort is normal.  Musculoskeletal:        General: Normal range of motion.     Cervical back: Normal range of motion.       Feet:  Skin:    General: Skin is warm and dry.  Neurological:     Mental Status: He is alert.  Psychiatric:        Mood and Affect: Mood normal.      UC Treatments / Results  Labs (all labs ordered are listed, but only abnormal results are displayed) Labs Reviewed - No data to display  EKG   Radiology No results found.  Procedures Procedures (including critical care time)  Medications Ordered in UC Medications - No data to display  Initial Impression / Assessment and Plan / UC Course  I have reviewed the triage vital signs and the nursing notes.  Pertinent labs & imaging results that were available during my care of the patient were reviewed by me and considered in my medical decision making (see  chart for details).     Chronic gout with flare Treated with prednisone Patient has follow-up appointment on Monday with his primary care Final Clinical Impressions(s) / UC Diagnoses   Final diagnoses:  Acute gout involving toe of right foot, unspecified cause     Discharge Instructions     Take the prednisone as prescribed Follow up with your doctor on Monday.     ED Prescriptions    Medication Sig Dispense Auth. Provider   predniSONE (STERAPRED UNI-PAK 21 TAB) 10 MG (21) TBPK tablet 6 tabs for 1 day, then 5 tabs for 1 das, then 4 tabs for 1 day, then 3 tabs for 1 day, 2 tabs for 1 day, then 1 tab for 1 day 21 tablet Steward Sames A, NP     PDMP not reviewed this encounter.   Janace Aris, NP 09/08/20 1352

## 2020-09-08 NOTE — ED Triage Notes (Signed)
Pt present right foot big toe pain, symptoms started yesterday. Pt has a hx of gout

## 2020-09-10 NOTE — Progress Notes (Signed)
Office Visit   Patient ID: Lucas Morse, male    DOB: 29-Sep-1976, 44 y.o.   MRN: [id number]  Subjective:  CC: left great toe pain, chronic hypertension  HPI 44 y.o. presents today for the above conditions.  Left great toe pain Last OV on 8/30 with Dr. Austin Miles at which time he was started on allopurinol. He has had 6 urgent care/ED visits in the last year due to gout. He has refused labs due to being uninsured. Pt called the [facility] on 9/23 requesting prednisone for a gout flare. I relayed that I would need to see him in the office prior to providing prednisone as I am unfamilar with this patient and did not want to risk mistreating another condition with prednisone.  He was seen in the ED on 9/24 at which time he was diagnosed with a gout flare and given a steroid taper.   Today he notes that the toe feels a Faulconer better but is still not completely resolved. He has not been taking the steroid taper as prescribed. He has been taking a couple pills a day for the pain.      ACTIVE MEDICATIONS   Current Outpatient Medications on File Prior to Visit  Medication Sig Dispense Refill  . allopurinol (ZYLOPRIM) 100 MG tablet Take 1 tablet (100 mg total) by mouth daily. 30 tablet 2  . predniSONE (STERAPRED UNI-PAK 21 TAB) 10 MG (21) TBPK tablet 6 tabs for 1 day, then 5 tabs for 1 das, then 4 tabs for 1 day, then 3 tabs for 1 day, 2 tabs for 1 day, then 1 tab for 1 day 21 tablet 0   No current facility-administered medications on file prior to visit.    ROS  Review of Systems  Constitutional: Negative for chills and fever.  Respiratory: Negative for shortness of breath.   Cardiovascular: Negative for chest pain and leg swelling.  Neurological: Negative for dizziness and light-headedness.    Objective:   BP (!) 166/90 (BP Location: Left Arm, Patient Position: Sitting, Cuff Size: Large)   Pulse 92   Temp 98.8 F (37.1 C) (Oral)   Ht 6' (1.829 m)   Wt 221 lb 3.2 oz (100.3 kg)   SpO2  100% Comment: room air  BMI 30.00 kg/m  Wt Readings from Last 3 Encounters:  09/11/20 221 lb 3.2 oz (100.3 kg)  08/14/20 217 lb 14.4 oz (98.8 kg)  12/22/15 200 lb (90.7 kg)   BP Readings from Last 3 Encounters:  09/11/20 (!) 166/90  09/08/20 (!) 190/109  08/14/20 (!) 176/86   Physical Exam Constitutional:      Appearance: Normal appearance.  Cardiovascular:     Rate and Rhythm: Normal rate and regular rhythm.  Pulmonary:     Effort: Pulmonary effort is normal.     Breath sounds: Normal breath sounds.  Musculoskeletal:     Comments: Swelling of the first metatarsal joint on the right foot with some mild erythema. Tenderness to palpation. No open wounds. ROM limited due to pain.     Health Maintenance:   Health Maintenance  Topic Date Due  . Hepatitis C Screening  Never done  . COVID-19 Vaccine (1) Never done  . HIV Screening  Never done  . TETANUS/TDAP  Never done  . INFLUENZA VACCINE  09/12/2021 (Originally 07/16/2020)     Assessment & Plan:   Problem List Items Addressed This Visit      Cardiovascular and Mediastinum   Uncontrolled Stage 2 Hypertension - Primary  Blood pressure is above goal in the office today. Pt reports medication compliance.  BMP done today showing normal renal function and electrolyte status Since his blood pressure is significantly above goal, I would like to start him on a 3rd agent. I considered hctz however, due to its increase in risk for gout, he will need an alternative medication. In combination with losartan, which he is already prescribed, and not wanting frequent lab draws, there is a risk for hyperkalemia. Plan --increase losartan to 50mg  daily --initiate metoprolol 50mg  twice daily --continue amlodipine 10mg  daily --f/u in 4w with repeat BMP. If renal function       Relevant Medications   amLODipine (NORVASC) 10 MG tablet   losartan (COZAAR) 50 MG tablet   metoprolol tartrate (LOPRESSOR) 50 MG tablet   Other Relevant Orders    Basic metabolic panel (Completed)     Other   Gout    Pt presents for management of a gout flare. Seen in the ED on 9/24 at which time he was given a steroid taper. Unfortunately, he has not been taking this as prescribed--taking a couple pills a day when his foot is hurting. Pt endorses diet compliance Exam consistent with gout flair Plan --provided education regarding medication instructions --extra prednisone sent to pharmacy for next fill --continue allopurinol --recommend obtaining a uric acid level once no longer in acute gout flare       Other Visit Diagnoses    Need for immunization against influenza       Uncontrolled stage 2 hypertension       Relevant Medications   amLODipine (NORVASC) 10 MG tablet   losartan (COZAAR) 50 MG tablet   metoprolol tartrate (LOPRESSOR) 50 MG tablet     **Attempted to contact pt regarding medication changes however unable to reach him at this time. Will try again tomorrow**   Pt discussed with Dr. , MD Internal Medicine Resident PGY-2 Internal Medicine Residency Pager: [phone] 09/12/2020 5:28 PM

## 2020-09-11 ENCOUNTER — Other Ambulatory Visit: Payer: Self-pay

## 2020-09-11 ENCOUNTER — Ambulatory Visit (INDEPENDENT_AMBULATORY_CARE_PROVIDER_SITE_OTHER): Payer: Self-pay | Admitting: Internal Medicine

## 2020-09-11 ENCOUNTER — Encounter: Payer: Self-pay | Admitting: Internal Medicine

## 2020-09-11 VITALS — BP 166/90 | HR 92 | Temp 98.8°F | Ht 72.0 in | Wt 221.2 lb

## 2020-09-11 DIAGNOSIS — I10 Essential (primary) hypertension: Secondary | ICD-10-CM

## 2020-09-11 DIAGNOSIS — Z23 Encounter for immunization: Secondary | ICD-10-CM

## 2020-09-11 DIAGNOSIS — M109 Gout, unspecified: Secondary | ICD-10-CM

## 2020-09-11 MED ORDER — PREDNISONE 20 MG PO TABS: 20.0000 mg | ORAL_TABLET | Freq: Every day | ORAL | 0 refills | Status: DC

## 2020-09-11 NOTE — Assessment & Plan Note (Addendum)
Pt presents for management of a gout flare. Seen in the ED on 9/24 at which time he was given a steroid taper. Unfortunately, he has not been taking this as prescribed--taking a couple pills a day when his foot is hurting. Pt endorses diet compliance Exam consistent with gout flair Plan --provided education regarding medication instructions --extra prednisone sent to pharmacy for next fill --continue allopurinol --recommend obtaining a uric acid level once no longer in acute gout flare

## 2020-09-11 NOTE — Patient Instructions (Addendum)
I have sent the prednisone to your pharmacy. Take 2 more days worth this time. The extra pills with be for the next flare. I would also like you to get a home blood pressure cuff so you can monitor it from home. Please do this twice a day for a week and then call in these results to the clinic so we can place further recommendations in regards to whether or not you need to make any changes. I will give you a call if there are any big abnormalities on your labs

## 2020-09-12 LAB — BASIC METABOLIC PANEL
BUN/Creatinine Ratio: 26 — ABNORMAL HIGH (ref 9–20)
BUN: 22 mg/dL (ref 6–24)
CO2: 22 mmol/L (ref 20–29)
Calcium: 10.1 mg/dL (ref 8.7–10.2)
Chloride: 97 mmol/L (ref 96–106)
Creatinine, Ser: 0.86 mg/dL (ref 0.76–1.27)
GFR calc Af Amer: 121 mL/min/{1.73_m2} (ref >59)
GFR calc non Af Amer: 105 mL/min/{1.73_m2} (ref >59)
Glucose: 155 mg/dL — ABNORMAL HIGH (ref 65–99)
Potassium: 4.5 mmol/L (ref 3.5–5.2)
Sodium: 136 mmol/L (ref 134–144)

## 2020-09-12 MED ORDER — LOSARTAN POTASSIUM 50 MG PO TABS: 50.0000 mg | ORAL_TABLET | Freq: Every day | ORAL | 1 refills | Status: DC

## 2020-09-12 MED ORDER — AMLODIPINE BESYLATE 10 MG PO TABS: 10.0000 mg | ORAL_TABLET | Freq: Every day | ORAL | 11 refills | Status: DC

## 2020-09-12 MED ORDER — METOPROLOL TARTRATE 50 MG PO TABS: 50.0000 mg | ORAL_TABLET | Freq: Two times a day (BID) | ORAL | 1 refills | Status: DC

## 2020-09-12 NOTE — Assessment & Plan Note (Addendum)
Blood pressure is above goal in the office today. Pt reports medication compliance.  BMP done today showing normal renal function and electrolyte status Since his blood pressure is significantly above goal, I would like to start him on a 3rd agent. I considered hctz however, due to its increase in risk for gout, he will need an alternative medication. In combination with losartan, which he is already prescribed, and not wanting frequent lab draws, there is a risk for hyperkalemia. Plan --increase losartan to 50mg  daily --initiate metoprolol 50mg  twice daily --continue amlodipine 10mg  daily --f/u in 4w with repeat BMP. If renal function

## 2020-09-13 NOTE — Progress Notes (Signed)
DOS 09/12/20:  Internal Medicine Clinic Attending  Case discussed with Dr. Ephriam Knuckles  At the time of the visit.  We reviewed the resident's history and exam and pertinent patient test results.  I agree with the assessment, diagnosis, and plan of care documented in the resident's note.

## 2020-09-15 NOTE — Telephone Encounter (Signed)
Call from pt - he saw Dr Ephriam Knuckles 9/27. Stated he was given rx for Prednisone, 10 pills now he's down to 1 pill and still in a lot of pain. Stated he cannot continue going to the the ED; he already has an $800 bill. Thanks

## 2020-09-15 NOTE — Telephone Encounter (Signed)
Hi Glenda, It would be very unusual for him not to have responded to the prednisone if it were a typical gout flare by this point which tells me he probably needs to be evaluated. If he does not want to go back to the ED, we can try to see him first available early next week.

## 2020-09-15 NOTE — Telephone Encounter (Signed)
Pt called and informed of Dr Murrell Redden response. Stated he already has an appt on Monday for BP check; informed he can also discuss this issue with her at that time. Stated ok.

## 2020-09-18 ENCOUNTER — Ambulatory Visit (INDEPENDENT_AMBULATORY_CARE_PROVIDER_SITE_OTHER): Payer: Self-pay | Admitting: Internal Medicine

## 2020-09-18 ENCOUNTER — Encounter: Payer: Self-pay | Admitting: Internal Medicine

## 2020-09-18 ENCOUNTER — Other Ambulatory Visit: Payer: Self-pay

## 2020-09-18 DIAGNOSIS — I10 Essential (primary) hypertension: Secondary | ICD-10-CM

## 2020-09-18 DIAGNOSIS — M109 Gout, unspecified: Secondary | ICD-10-CM

## 2020-09-18 MED ORDER — PREDNISONE 20 MG PO TABS: 40.0000 mg | ORAL_TABLET | Freq: Every day | ORAL | 0 refills | Status: AC

## 2020-09-18 MED ORDER — ALLOPURINOL 100 MG PO TABS: 200.0000 mg | ORAL_TABLET | Freq: Every day | ORAL | 2 refills | Status: DC

## 2020-09-18 NOTE — Progress Notes (Signed)
Acute Office Visit  Subjective:    Patient ID: Lucas Morse, male    DOB: 1976/01/06, 44 y.o.   MRN: [id number]   Chief complaint: Gout flare, HTN   HPI Patient is in today for gout flare and follow-up on HTN. Please see problem based charting for further details.   Past Medical History:  Diagnosis Date  . Essential hypertension   . Gout     Past Surgical History:  Procedure Laterality Date  . EYE SURGERY      Family History  Problem Relation Age of Onset  . HIV/AIDS Mother   . Stomach cancer Paternal Grandmother   . Gout Maternal Uncle     Social History   Socioeconomic History  . Marital status: Single    Spouse name: Not on file  . Number of children: Not on file  . Years of education: Not on file  . Highest education level: Not on file  Occupational History  . Not on file  Tobacco Use  . Smoking status: Current Every Day Smoker    Packs/day: 1.00    Types: Cigarettes  . Smokeless tobacco: Never Used  . Tobacco comment: cutting back  Substance and Sexual Activity  . Alcohol use: Yes    Alcohol/week: 0.0 standard drinks  . Drug use: No  . Sexual activity: Not on file  Other Topics Concern  . Not on file  Social History Narrative  . Not on file   Social Determinants of Health   Financial Resource Strain:   . Difficulty of Paying Living Expenses: Not on file  Food Insecurity:   . Worried About Programme researcher, broadcasting/film/video in the Last Year: Not on file  . Ran Out of Food in the Last Year: Not on file  Transportation Needs:   . Lack of Transportation (Medical): Not on file  . Lack of Transportation (Non-Medical): Not on file  Physical Activity:   . Days of Exercise per Week: Not on file  . Minutes of Exercise per Session: Not on file  Stress:   . Feeling of Stress : Not on file  Social Connections:   . Frequency of Communication with Friends and Family: Not on file  . Frequency of Social Gatherings with Friends and Family: Not on file  . Attends  Religious Services: Not on file  . Active Member of Clubs or Organizations: Not on file  . Attends Banker Meetings: Not on file  . Marital Status: Not on file  Intimate Partner Violence:   . Fear of Current or Ex-Partner: Not on file  . Emotionally Abused: Not on file  . Physically Abused: Not on file  . Sexually Abused: Not on file    Outpatient Medications Prior to Visit  Medication Sig Dispense Refill  . amLODipine (NORVASC) 10 MG tablet Take 1 tablet (10 mg total) by mouth daily. 30 tablet 11  . losartan (COZAAR) 50 MG tablet Take 1 tablet (50 mg total) by mouth daily. 30 tablet 1  . metoprolol tartrate (LOPRESSOR) 50 MG tablet Take 1 tablet (50 mg total) by mouth 2 (two) times daily. 60 tablet 1  . allopurinol (ZYLOPRIM) 100 MG tablet Take 1 tablet (100 mg total) by mouth daily. 30 tablet 2  . predniSONE (DELTASONE) 20 MG tablet Take 1 tablet (20 mg total) by mouth daily. 10 tablet 0  . predniSONE (STERAPRED UNI-PAK 21 TAB) 10 MG (21) TBPK tablet 6 tabs for 1 day, then 5 tabs for 1 das, then  4 tabs for 1 day, then 3 tabs for 1 day, 2 tabs for 1 day, then 1 tab for 1 day 21 tablet 0   No facility-administered medications prior to visit.    No Known Allergies  Review of Systems  Constitutional: Negative for chills and fever.  Musculoskeletal: Positive for arthralgias, gait problem and joint swelling.  Skin: Negative for rash and wound.       Objective:    Physical Exam Constitutional:      General: He is not in acute distress.    Appearance: Normal appearance.  Feet:     Comments: Right first MTP with erythema, warmth and swelling. Manipulation of the joint is painful, particularly with flexion. Strong DP pulse. No overlying skin changes.  Neurological:     Mental Status: He is alert.     BP (!) 178/119 (BP Location: Left Arm, Patient Position: Sitting, Cuff Size: Normal)   Pulse (!) 106   Wt 218 lb 9.6 oz (99.2 kg)   SpO2 100%   BMI 29.65 kg/m  Wt  Readings from Last 3 Encounters:  09/18/20 218 lb 9.6 oz (99.2 kg)  09/11/20 221 lb 3.2 oz (100.3 kg)  08/14/20 217 lb 14.4 oz (98.8 kg)    Health Maintenance Due  Topic Date Due  . Hepatitis C Screening  Never done  . COVID-19 Vaccine (1) Never done  . HIV Screening  Never done  . TETANUS/TDAP  Never done    There are no preventive care reminders to display for this patient.   No results found for: TSH Lab Results  Component Value Date   WBC 6.6 12/22/2015   HGB 15.9 12/22/2015   HCT 46.4 12/22/2015   MCV 87.9 12/22/2015   Lab Results  Component Value Date   NA 136 09/11/2020   K 4.5 09/11/2020   CO2 22 09/11/2020   GLUCOSE 155 (H) 09/11/2020   BUN 22 09/11/2020   CREATININE 0.86 09/11/2020   CALCIUM 10.1 09/11/2020   No results found for: CHOL No results found for: HDL No results found for: LDLCALC No results found for: TRIG No results found for: CHOLHDL No results found for: XVQM0Q     Assessment & Plan:   Problem List Items Addressed This Visit    None       Meds ordered this encounter  Medications  . predniSONE (DELTASONE) 20 MG tablet    Sig: Take 2 tablets (40 mg total) by mouth daily with breakfast for 5 days.    Dispense:  10 tablet    Refill:  0  . allopurinol (ZYLOPRIM) 100 MG tablet    Sig: Take 2 tablets (200 mg total) by mouth daily.    Dispense:  60 tablet    Refill:  2     Toney Lizaola D Cagney Degrace, DO

## 2020-09-18 NOTE — Assessment & Plan Note (Signed)
Blood pressure elevated again in clinic today. Likely multifactorial from pain and confusion regarding his blood pressure medication regimen.  I walked him through the changes that Dr. Ephriam Knuckles had made a week ago, and he endorsed understanding. He had not yet started Metoprolol which was added last week.  -continue newly increased Losartan 50 mg daily -continue newly increased amlodipine to 10 mg daily -start metoprolol 50 mg BID

## 2020-09-18 NOTE — Assessment & Plan Note (Signed)
Patient presents for continued pain secondary to gout flare. He had been taking Prednisone 40 mg for 5 days last week, but ran out over the weekend. He mentions his flares take a Reimers longer to resolve because he cannot afford to be out of work to stay off of it and is constantly going up and down stairs a lot of the day.   Plan -extend Prednisone 40 mg daily out for another 5 days. Instructed to call if he is still having pain. Will need to consider referral vs different treatment option.  -increase allopurinol to 200 mg daily to hopefully prevent recurrent flares

## 2020-09-18 NOTE — Patient Instructions (Addendum)
[name], It was nice meeting you!   Today we discussed:  1. Blood pressure medications - take two of the losartan 25 mg tablets to equal 50 mg daily until you run out - do the same thing with your amlodipine 5 mg tablets; take 2 daily until you run out - the metoprolol is new and also for blood pressure. Take 1 tablet two times daily  2. Gout -I am sending in 5 days of Prednisone 40 mg. If it is still not improved after that, please call and we will need to figure out a different treatment plan.  -I am also increasing your allopurinol dose to 200 mg daily which will be updated in the new prescription I have sent.   Take care, Dr. Chesley Morse

## 2020-09-21 NOTE — Progress Notes (Signed)
Internal Medicine Clinic Attending  Case discussed with Dr. Bloomfield  At the time of the visit.  We reviewed the resident's history and exam and pertinent patient test results.  I agree with the assessment, diagnosis, and plan of care documented in the resident's note.  

## 2020-10-04 ENCOUNTER — Telehealth: Payer: Self-pay | Admitting: *Deleted

## 2020-10-04 NOTE — Telephone Encounter (Signed)
If he is continuing to have problems with gout he should can either come in or arrange for a telehealth visit.

## 2020-10-04 NOTE — Telephone Encounter (Signed)
Patient called requesting refill on prednisone for right foot gout flare. States he was seen on 09/18/2020 for this and asked for there to be one refill on prednisone Rx as he knew the gout would flare again. Because there wasn't a refill, he took one tab of prednisone (20 mg total) for 10 days instead of 2 tabs for 5 days. Last tab was yesterday and pain in right foot has returned. States he has been taking the allopurinol and metoprolol as directed. States he is not able to take more time off work to come back to clinic. Please advise. Kinnie Feil, BSN, RN-BC

## 2020-10-05 ENCOUNTER — Ambulatory Visit (INDEPENDENT_AMBULATORY_CARE_PROVIDER_SITE_OTHER): Payer: Self-pay | Admitting: Student

## 2020-10-05 ENCOUNTER — Other Ambulatory Visit: Payer: Self-pay

## 2020-10-05 DIAGNOSIS — M109 Gout, unspecified: Secondary | ICD-10-CM

## 2020-10-05 MED ORDER — PREDNISONE 20 MG PO TABS: 40.0000 mg | ORAL_TABLET | Freq: Every day | ORAL | 0 refills | Status: DC

## 2020-10-06 NOTE — Progress Notes (Signed)
   CC: right big toe pain   This is a telephone encounter between Lucas Morse and Lucas Morse on 10/06/2020 for pain and swelling of the right great toe. The visit was conducted with the patient located at home and Lucas Morse at [facility]. The patient's identity was confirmed using their DOB and current address. The patient has consented to being evaluated through a telephone encounter and understands the associated risks (an examination cannot be Morse and the patient may need to come in for an appointment) / benefits (allows the patient to remain at home, decreasing exposure to coronavirus). I personally spent 15 minutes on medical discussion.   HPI:  [name] is a 44 y.o. with PMH as below.   Please see A&P for assessment of the patient's acute and chronic medical conditions.   Past Medical History:  Diagnosis Date  . Essential hypertension   . Gout    Review of Systems:  Patient with right great to pain swelling and redness worsening in the last 2 days. No other joint pain, no fever, no trauma, no injury    Assessment & Plan:   See Encounters Tab for problem based charting.  Patient seen with Dr. Oswaldo Morse

## 2020-10-06 NOTE — Progress Notes (Signed)
Internal Medicine Clinic Attending  I spoke with the patient by phone.  I personally confirmed the key portions of the history documented by Dr. Liang and I reviewed pertinent patient test results.  The assessment, diagnosis, and plan were formulated together and I agree with the documentation in the resident's note.  

## 2020-10-06 NOTE — Assessment & Plan Note (Signed)
Patient presents for continue pain in the right great toe due to gout flare in 09/12/2020. He was prescribed prednisone 40 mg for 5 days and then given additional 5 days at visit on 09/18/2020. States since he was not given a refill he too 20 mg daily for 10 days with some improvement in pain and swelling but did not completely resolve the flare. In the last 2 days the pain and swelling have increased and he has had to continue to work while having this pain. He works in [employer] and walk up and down stairs frequently for work worsening his symptoms. He has been taking his allopurinol 200 mg daily. Patient states in the past he always had refills on his prednisone in case he needed more medication and if he were to have a sudden flare. Because he did not have a refill he was attempting to stretch out his medication. Counseled him to take full dose of medication and will give him 2 refills incase he need more than 5 days of prednisone and so he can have a refill for a future flare. He is agreeable to this plan and states he will take prednisone as instructed and call if he has future flare. Discussed importance of preventing future flares and that he should follow up after flare resolved for uric acid level (last mesured 2 years ago) so that his allopurinol dose can be adjusted. He is agreeable to this plan.  Plan prednisone 40 mg daily for 5 days with 2 refills Continue allopurinol 200 mg daily Patient to follow up after flare to recheck uric acid and may need to titrate allopurinol if it is high.

## 2020-10-31 ENCOUNTER — Other Ambulatory Visit: Payer: Self-pay | Admitting: Internal Medicine

## 2020-10-31 DIAGNOSIS — M109 Gout, unspecified: Secondary | ICD-10-CM

## 2020-10-31 NOTE — Telephone Encounter (Signed)
Refill Request-Pt would like a call back about his milligrams of the Following Medication  prednisone 20 mg tablet    [id number] - [place], [place] - [address] AT [facility] OF [address] (Ph: [phone])

## 2020-11-01 MED ORDER — PREDNISONE 20 MG PO TABS: ORAL_TABLET | ORAL | 0 refills | Status: DC

## 2020-11-01 NOTE — Assessment & Plan Note (Signed)
Spoke on phone with patient regarding recurrent gout flare. He states that it resolved with steroids from last appointment, but then recurred. He was taking 40mg  until he felt better, then stopped instead of tapering. Discussed a prednisone taper and that this may reduce the risk of rebound flares. He expresses understanding.  -prescribed course of steroid with taper -as requested for busy work schedule and as discussed at last in person visit, will prescribe a refill

## 2020-11-01 NOTE — Telephone Encounter (Signed)
Attempted to call patient x1 regarding this and left voicemail, will call again later

## 2020-11-01 NOTE — Telephone Encounter (Signed)
Patient called back to speak with Dr. Imogene Burn. Kinnie Feil, BSN, RN-BC

## 2020-12-11 ENCOUNTER — Other Ambulatory Visit: Payer: Self-pay

## 2020-12-11 NOTE — Telephone Encounter (Signed)
Pt is requesting his  predniSONE (DELTASONE) 20 MG tablet  [facility][id number] - [place], [place] - [address] AT [facility] OF [address] Phone:  [phone]  Fax:  [phone]

## 2020-12-11 NOTE — Telephone Encounter (Signed)
Needs telehealth or in person visit before another steroid prescription.  He has had 2 Rx now and gout should have resolved by now.    Thanks

## 2020-12-12 NOTE — Telephone Encounter (Signed)
Front office - please schedule pt an in person or telehealth appt  for steroid refill per Dr Criselda Peaches.  Thanks

## 2020-12-12 NOTE — Telephone Encounter (Signed)
Just spoke with patient.  He agreed to a telehealth appointment for tomorrow 12/13/2020 at 8:45 am with Dr. Nedra Hai.

## 2020-12-13 ENCOUNTER — Ambulatory Visit (INDEPENDENT_AMBULATORY_CARE_PROVIDER_SITE_OTHER): Payer: Self-pay | Admitting: Internal Medicine

## 2020-12-13 ENCOUNTER — Other Ambulatory Visit: Payer: Self-pay

## 2020-12-13 DIAGNOSIS — M109 Gout, unspecified: Secondary | ICD-10-CM

## 2020-12-13 MED ORDER — ALLOPURINOL 100 MG PO TABS: 300.0000 mg | ORAL_TABLET | Freq: Every day | ORAL | 2 refills | Status: DC

## 2020-12-13 MED ORDER — PREDNISONE 20 MG PO TABS: ORAL_TABLET | ORAL | 0 refills | Status: DC

## 2020-12-13 NOTE — Progress Notes (Signed)
  [facility] Internal Medicine Residency Telephone Encounter Continuity Care Appointment  HPI:   This telephone encounter was created for [name] on 12/13/2020 for the following purpose/cc Elbow pain.  Lucas Morse is a 44 yo Mw / PMh of HTN, GOUT presenting to [facility] via teleheatlh visit for elbow pain. He was in his usual state of health until couple days ago when he began to experience pain on his elbow. 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 Also mentiosn feeling similar to prior gout episodes. States he was previously prescribed steroids with refills for his gouty arthritis. Requesting additional refills   Past Medical History:  Past Medical History:  Diagnosis Date  . Essential hypertension   . Gout       ROS:  Review of Systems  Constitutional: Negative for chills, fever and malaise/fatigue.  Eyes: Negative for blurred vision.  Respiratory: Negative for shortness of breath.   Cardiovascular: Negative for chest pain, palpitations and leg swelling.  Gastrointestinal: Negative for constipation, diarrhea, nausea and vomiting.  Musculoskeletal: Positive for joint pain.       Assessment / Plan / Recommendations:   Please see A&P under problem oriented charting for assessment of the patient's acute and chronic medical conditions.   As always, pt is advised that if symptoms worsen or new symptoms arise, they should go to an urgent care facility or to to ER for further evaluation.   Consent and Medical Decision Making:   Patient discussed with Lucas Morse  This is a telephone encounter between Lucas Morse and Lucas Morse on 12/13/2020 for elbow pain. The visit was conducted with the patient located at home and Lucas Morse at [facility]. The patient's identity was confirmed using their DOB and current address. The patient has consented to being evaluated through a telephone encounter and understands the  associated risks (an examination cannot be done and the patient may need to come in for an appointment) / benefits (allows the patient to remain at home, decreasing exposure to coronavirus). I personally spent 23 minutes on medical discussion.

## 2020-12-14 ENCOUNTER — Encounter: Payer: Self-pay | Admitting: Internal Medicine

## 2020-12-14 NOTE — Assessment & Plan Note (Signed)
Present with complaint of elbow pain. Acute onset. Describes similar symptom as prior gouty episodes. Mentions continuing to take allopurinol as prescribed. Requests refills on prednisone due to difficulty with scheduling in-person appointments.  A/P Had prolonged discussion regarding sequelae of prolonged prednisone use and inappropriateness of taking steroids so often. Discussed need to prevent re-occurrence with up-titration of allopurinol and advised to schedule in-person appointment to recheck uric acid level. Meanwhile, increase allopurinol dose now. Also advised that I will send a short course for current flare but will not send refills. - Increase allopurinol to 300mg  - Steroid pak ordered - Return to clinic for uric acid level

## 2020-12-19 NOTE — Progress Notes (Signed)
Internal Medicine Clinic Attending  Case discussed with Dr. Lee  At the time of the visit.  We reviewed the resident's history and pertinent patient test results.  I agree with the assessment, diagnosis, and plan of care documented in the resident's note.  

## 2020-12-28 ENCOUNTER — Other Ambulatory Visit: Payer: Self-pay | Admitting: *Deleted

## 2020-12-28 MED ORDER — METOPROLOL TARTRATE 50 MG PO TABS: 50.0000 mg | ORAL_TABLET | Freq: Two times a day (BID) | ORAL | 1 refills | Status: DC

## 2020-12-28 MED ORDER — LOSARTAN POTASSIUM 50 MG PO TABS: 50.0000 mg | ORAL_TABLET | Freq: Every day | ORAL | 1 refills | Status: DC

## 2021-01-23 ENCOUNTER — Other Ambulatory Visit: Payer: Self-pay

## 2021-01-23 DIAGNOSIS — M109 Gout, unspecified: Secondary | ICD-10-CM

## 2021-01-23 MED ORDER — PREDNISONE 20 MG PO TABS: ORAL_TABLET | ORAL | 0 refills | Status: DC

## 2021-01-23 NOTE — Telephone Encounter (Signed)
Called pt - stated he's down to 3 pills; gouty is flaring up; stated he needs his med in order to [place] currently working 6 days a week for at least the next 2-3 weeks; off on Sundays. Unable to schedule an in person at this time.

## 2021-01-23 NOTE — Telephone Encounter (Signed)
Need refill on predniSONE (DELTASONE) 20 MG tablet ;pt contact [phone]   [facility] #[id number] - [place], [place] - [address] AT [facility] OF [address]

## 2021-02-14 ENCOUNTER — Ambulatory Visit (INDEPENDENT_AMBULATORY_CARE_PROVIDER_SITE_OTHER): Payer: Self-pay | Admitting: Student

## 2021-02-14 ENCOUNTER — Telehealth: Payer: Self-pay

## 2021-02-14 ENCOUNTER — Other Ambulatory Visit: Payer: Self-pay

## 2021-02-14 ENCOUNTER — Encounter: Payer: Self-pay | Admitting: Student

## 2021-02-14 VITALS — BP 136/79 | HR 95 | Temp 98.5°F | Ht 72.0 in | Wt 233.4 lb

## 2021-02-14 DIAGNOSIS — M109 Gout, unspecified: Secondary | ICD-10-CM

## 2021-02-14 MED ORDER — PREDNISONE 20 MG PO TABS: ORAL_TABLET | ORAL | 0 refills | Status: DC

## 2021-02-14 NOTE — Telephone Encounter (Signed)
Need refills on predniSONE (DELTASONE) 20 MG tablet ; pt contact [phone]   [facility] #[id number] - [place], [place] - [address] AT [facility] OF [address]  Pt is unable to come in due to they are [place] at the airport, pls contact [phone]

## 2021-02-14 NOTE — Telephone Encounter (Signed)
Dr. Ephriam Knuckles,  Please see LOV note from 12/29, pt states he can't come in (see his message below).  Are you ok with Telephone appt - blue or would you like me to schedule him next available in person?  Thank you, Skip Estimable, RN,BSN

## 2021-02-14 NOTE — Assessment & Plan Note (Addendum)
Presents for L foot pain. Reports acute onset yesterday evening. Location is left mid-foot, dorsal aspect > plantar aspect of foot. Denies trauma to the area. 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 States he had some prednisone leftover from his last gout flare in 11/2020, so took 40 mg of prednisone with improvement in pain and associated swelling. Has been taking allopurinol 300 mg (increased from 200 mg at last visit) but notes he did not take since the onset of the flare. States his gout flares have primarily been in his L first MTP. Reports drinking between 6-12 beers over the course of most weekends. Has been trying to work on improving his diet. Reports prednisone is the most effective acute gout treatment he has tried. Notes colchicine gives him diarrhea, and naproxen is not effective. He states he understands the risks of prednisone but says, "It's a risk [he's] willing to take."   Assessment/Plan: Patient presenting with L mid-foot pain, having taken 40 mg prednisone yesterday evening. On exam, there is trace edema around the mid foot, no erythema. Counseled the patient extensively on the adverse effects of long-term prednisone use. Also explained the importance of monitoring his uric acid level in order to titrate his allopurinol.  - Will prescribe a short course of steroids for the current flare but will not send refills - Check uric acid level today. If elevated, will increase allopurinol dose which is currently at 300 mg daily. If not elevated, will bring the patient back for a lab visit to recheck his uric acid level when he is at a steady state. - Provided patient with and discussed list of purine containing foods  ADDENDUM (02/16/21): - Uric acid elevated at 6.6 - Called patient to discuss result. Instructed him to increase his daily allopurinol dose from 300 mg daily (3 - 100 mg tablets daily) to 400 mg daily (4 - 100 mg tablets daily) - Will  have patient follow-up in 4 weeks for lab-only visit to repeat uric acid level

## 2021-02-14 NOTE — Progress Notes (Signed)
   CC: L foot pain  HPI:  [name] is a 45 y.o. man with history of HTN and gout who presents to clinic for left foot pain. His last clinic visit was a telehealth visit on 12/13/20 with Dr. Nedra Hai.   To see the details of this patient's management of their acute and chronic problems, please refer to the Assessment & Plan under the Encounters tab.    Past Medical History:  Diagnosis Date  . Essential hypertension   . Gout    Review of Systems:    Review of Systems  Constitutional: Negative for chills and fever.  Musculoskeletal: Positive for joint pain. Negative for myalgias.  Skin: Negative for itching and rash.  Neurological: Negative for weakness.    Physical Exam:  Vitals:   02/14/21 1448 02/14/21 1455  BP: (!) 156/77 136/79  Pulse: 93 95  Temp: 98.5 F (36.9 C)   TempSrc: Oral   SpO2: 99%   Weight: 233 lb 6.4 oz (105.9 kg)   Height: 6' (1.829 m)    Constitutional: well-appearing man sitting in chair, in no acute distress HENT: normocephalic atraumatic, mucous membranes moist Eyes: conjunctiva non-erythematous Neck: supple Pulmonary/Chest: normal work of breathing on room air Abdominal: non-distended MSK: normal bulk and tone; left mid-foot with trace non-pitting edema, no erythema Neurological: alert & oriented x 3, normal gait Skin: warm and dry   Assessment & Plan:   See Encounters Tab for problem based charting.  Patient discussed with Dr. Mikey Bussing

## 2021-02-14 NOTE — Patient Instructions (Addendum)
Lucas Morse,   Thank you for your visit to the [facility] Internal Medicine Clinic today. It was a pleasure meeting you. Today we discussed the following:  1) Gout flare of your right foot:  - I have sent in a prescription for prednisone to your pharmacy. As discussed, the prednisone treats acute flares but DOES NOT prevent future gout attacks. In addition, long-term steroid use is associated with many adverse effects. - Continue your allopurinol 300 mg daily. Our goal with the allopurinol is to lower your uric acid level to prevent future flares. - We are checking your uric acid level today. We will adjust your allopurinol dose based on the result. There is a chance we will need you to return for another uric acid level.  We would like to see you back if you have another flare. I will call you to discuss the results of your uric acid level and next steps.   If you have any questions or concerns, please call our clinic at [phone] between 9am-5pm. Outside of these hours, call [phone] and ask for the internal medicine resident on call. If you feel you are having a medical emergency please call 911.   Gout  Gout is painful swelling of your joints. Gout is a type of arthritis. It is caused by having too much uric acid in your body. Uric acid is a chemical that is made when your body breaks down substances called purines. If your body has too much uric acid, sharp crystals can form and build up in your joints. This causes pain and swelling. Gout attacks can happen quickly and be very painful (acute gout). Over time, the attacks can affect more joints and happen more often (chronic gout). What are the causes?  Too much uric acid in your blood. This can happen because: ? Your kidneys do not remove enough uric acid from your blood. ? Your body makes too much uric acid. ? You eat too many foods that are high in purines. These foods include organ meats, some seafood, and beer.  Trauma or  stress. What increases the risk?  Having a family history of gout.  Being male and middle-aged.  Being male and having gone through menopause.  Being very overweight (obese).  Drinking alcohol, especially beer.  Not having enough water in the body (being dehydrated).  Losing weight too quickly.  Having an organ transplant.  Having lead poisoning.  Taking certain medicines.  Having kidney disease.  Having a skin condition called psoriasis. What are the signs or symptoms? An attack of acute gout usually happens in just one joint. The most common place is the big toe. Attacks often start at night. Other joints that may be affected include joints of the feet, ankle, knee, fingers, wrist, or elbow. Symptoms of an attack may include:  Very bad pain.  Warmth.  Swelling.  Stiffness.  Shiny, red, or purple skin.  Tenderness. The affected joint may be very painful to touch.  Chills and fever. Chronic gout may cause symptoms more often. More joints may be involved. You may also have white or yellow lumps (tophi) on your hands or feet or in other areas near your joints.   How is this treated?  Treatment for this condition has two phases: treating an acute attack and preventing future attacks.  Acute gout treatment may include: ? NSAIDs. ? Steroids. These are taken by mouth or injected into a joint. ? Colchicine. This medicine relieves pain and swelling. It can  be given by mouth or through an IV tube.  Preventive treatment may include: ? Taking small doses of NSAIDs or colchicine daily. ? Using a medicine that reduces uric acid levels in your blood. ? Making changes to your diet. You may need to see a food expert (dietitian) about what to eat and drink to prevent gout. Follow these instructions at home: During a gout attack  If told, put ice on the painful area: ? Put ice in a plastic bag. ? Place a towel between your skin and the bag. ? Leave the ice on for 20  minutes, 2-3 times a day.  Raise (elevate) the painful joint above the level of your heart as often as you can.  Rest the joint as much as possible. If the joint is in your leg, you may be given crutches.  Follow instructions from your doctor about what you cannot eat or drink.   Avoiding future gout attacks  Eat a low-purine diet. Avoid foods and drinks such as: ? Liver. ? Kidney. ? Anchovies. ? Asparagus. ? Herring. ? Mushrooms. ? Mussels. ? Beer.  Stay at a healthy weight. If you want to lose weight, talk with your doctor. Do not lose weight too fast.  Start or continue an exercise plan as told by your doctor. Eating and drinking  Drink enough fluids to keep your pee (urine) pale yellow.  If you drink alcohol: ? Limit how much you use to:  0-1 drink a day for women.  0-2 drinks a day for men. ? Be aware of how much alcohol is in your drink. In the [place] one drink equals one 12 oz bottle of beer (355 mL), one 5 oz glass of wine (148 mL), or one 1 oz glass of hard liquor (44 mL). General instructions  Take over-the-counter and prescription medicines only as told by your doctor.  Do not drive or use heavy machinery while taking prescription pain medicine.  Return to your normal activities as told by your doctor. Ask your doctor what activities are safe for you.  Keep all follow-up visits as told by your doctor. This is important. Contact a doctor if:  You have another gout attack.  You still have symptoms of a gout attack after 10 days of treatment.  You have problems (side effects) because of your medicines.  You have chills or a fever.  You have burning pain when you pee (urinate).  You have pain in your lower back or belly. Get help right away if:  You have very bad pain.  Your pain cannot be controlled.  You cannot pee. Summary  Gout is painful swelling of the joints.  The most common site of pain is the big toe, but it can affect other  joints.  Medicines and avoiding some foods can help to prevent and treat gout attacks. This information is not intended to replace advice given to you by your health care provider. Make sure you discuss any questions you have with your health care provider. Document Revised: 06/24/2018 Document Reviewed: 06/24/2018 Elsevier Patient Education  2021 Elsevier Inc.  Low-Purine Eating Plan A low-purine eating plan involves making food choices to limit your intake of purine. Purine is a kind of uric acid. Too much uric acid in your blood can cause certain conditions, such as gout and kidney stones. Eating a low-purine diet can help control these conditions. What are tips for following this plan? Reading food labels  Avoid foods with saturated or Trans fat.  Check the ingredient list of grains-based foods, such as bread and cereal, to make sure that they contain whole grains.  Check the ingredient list of sauces or soups to make sure they do not contain meat or fish.  When choosing soft drinks, check the ingredient list to make sure they do not contain high-fructose corn syrup. Shopping  Buy plenty of fresh fruits and vegetables.  Avoid buying canned or fresh fish.  Buy dairy products labeled as low-fat or nonfat.  Avoid buying premade or processed foods. These foods are often high in fat, salt (sodium), and added sugar.   Cooking  Use olive oil instead of butter when cooking. Oils like olive oil, canola oil, and sunflower oil contain healthy fats. Meal planning  Learn which foods do or do not affect you. If you find out that a food tends to cause your gout symptoms to flare up, avoid eating that food. You can enjoy foods that do not cause problems. If you have any questions about a food item, talk with your dietitian or health care provider.  Limit foods high in fat, especially saturated fat. Fat makes it harder for your body to get rid of uric acid.  Choose foods that are lower in  fat and are lean sources of protein. General guidelines  Limit alcohol intake to no more than 1 drink a day for nonpregnant women and 2 drinks a day for men. One drink equals 12 oz of beer, 5 oz of wine, or 1 oz of hard liquor. Alcohol can affect the way your body gets rid of uric acid.  Drink plenty of water to keep your urine clear or pale yellow. Fluids can help remove uric acid from your body.  If directed by your health care provider, take a vitamin C supplement.  Work with your health care provider and dietitian to develop a plan to achieve or maintain a healthy weight. Losing weight can help reduce uric acid in your blood. What foods are recommended? The items listed may not be a complete list. Talk with your dietitian about what dietary choices are best for you. Foods low in purines Foods low in purines do not need to be limited. These include:  All fruits.  All low-purine vegetables, pickles, and olives.  Breads, pasta, rice, cornbread, and popcorn. Cake and other baked goods.  All dairy foods.  Eggs, nuts, and nut butters.  Spices and condiments, such as salt, herbs, and vinegar.  Plant oils, butter, and margarine.  Water, sugar-free soft drinks, tea, coffee, and cocoa.  Vegetable-based soups, broths, sauces, and gravies. Foods moderate in purines Foods moderate in purines should be limited to the amounts listed.   cup of asparagus, cauliflower, spinach, mushrooms, or green peas, each day.  2/3 cup uncooked oatmeal, each day.   cup dry wheat bran or wheat germ, each day.  2-3 ounces of meat or poultry, each day.  4-6 ounces of shellfish, such as crab, lobster, oysters, or shrimp, each day.  1 cup cooked beans, peas, or lentils, each day.  Soup, broths, or bouillon made from meat or fish. Limit these foods as much as possible. What foods are not recommended? The items listed may not be a complete list. Talk with your dietitian about what dietary choices  are best for you. Limit your intake of foods high in purines, including:  Beer and other alcohol.  Meat-based gravy or sauce.  Canned or fresh fish, such as: ? Anchovies, sardines, herring, and tuna. ?  Mussels and scallops. ? Codfish, trout, and haddock.  Tomasa Blase.  Organ meats, such as: ? Liver or kidney. ? Tripe. ? Sweetbreads (thymus gland or pancreas).  Wild Education officer, environmental.  Yeast or yeast extract supplements.  Drinks sweetened with high-fructose corn syrup. Summary  Eating a low-purine diet can help control conditions caused by too much uric acid in the body, such as gout or kidney stones.  Choose low-purine foods, limit alcohol, and limit foods high in fat.  You will learn over time which foods do or do not affect you. If you find out that a food tends to cause your gout symptoms to flare up, avoid eating that food. This information is not intended to replace advice given to you by your health care provider. Make sure you discuss any questions you have with your health care provider. Document Revised: 03/16/2020 Document Reviewed: 03/16/2020 Elsevier Patient Education  2021 [employer].

## 2021-02-14 NOTE — Telephone Encounter (Signed)
Please have him arrange an in person office visit at his earliest convenience.

## 2021-02-14 NOTE — Telephone Encounter (Signed)
RTC, informed patient he would need a f/u, in person appt.  Pt states he cannot come d/t his job and that he needs his RX for the prednisone.  RN reiterated to patient that MD will need to evaluate him in person.  He verbalized understanding, appt given for today at 3:45/ Dr. Claudette Laws . SChaplin, RN,BSN

## 2021-02-15 LAB — URIC ACID: Uric Acid: 6.6 mg/dL (ref 3.8–8.4)

## 2021-02-16 ENCOUNTER — Telehealth: Payer: Self-pay | Admitting: Internal Medicine

## 2021-02-16 NOTE — Telephone Encounter (Signed)
Per patient is returning Dr. Aura Dials phone call about test results.  Please call back.

## 2021-02-16 NOTE — Progress Notes (Signed)
Internal Medicine Clinic Attending  Case discussed with Dr. Watson  At the time of the visit.  We reviewed the resident's history and exam and pertinent patient test results.  I agree with the assessment, diagnosis, and plan of care documented in the resident's note.  

## 2021-02-16 NOTE — Addendum Note (Signed)
Addended by: Alphonzo Severance on: 02/16/2021 02:01 PM   Modules accepted: Orders

## 2021-02-16 NOTE — Telephone Encounter (Signed)
Just spoke with patient to discuss result and next steps.

## 2021-02-23 ENCOUNTER — Other Ambulatory Visit: Payer: Self-pay | Admitting: Internal Medicine

## 2021-02-23 ENCOUNTER — Encounter: Payer: Self-pay | Admitting: Internal Medicine

## 2021-03-08 ENCOUNTER — Other Ambulatory Visit: Payer: Self-pay | Admitting: Internal Medicine

## 2021-03-19 ENCOUNTER — Telehealth: Payer: Self-pay | Admitting: Internal Medicine

## 2021-03-19 ENCOUNTER — Other Ambulatory Visit (INDEPENDENT_AMBULATORY_CARE_PROVIDER_SITE_OTHER): Payer: Self-pay

## 2021-03-19 DIAGNOSIS — M109 Gout, unspecified: Secondary | ICD-10-CM

## 2021-03-19 NOTE — Telephone Encounter (Signed)
MEDICATION REFILL   predniSONE (DELTASONE) 20 MG tablet   [facility] #[id number] - [place], [place] - [address] AT [facility] OF [address] Phone:  [phone]  Fax:  [phone]

## 2021-03-19 NOTE — Telephone Encounter (Signed)
Pt had lab only visit this morning for uric acid level, pt currently on Allopurinol. Please advise on prednisone refill or if you want to make any increases after uric acid level comes back. Thank you! SChaplin, RN,BSN

## 2021-03-20 LAB — URIC ACID: Uric Acid: 5.7 mg/dL (ref 3.8–8.4)

## 2021-03-21 NOTE — Telephone Encounter (Signed)
Reviewing his chart he should have tapered off the prednisone once his symptoms improved, is he still having a gout attack? He was prescribed a steroid taper. I would not refill at this time unless he is continuing to have a gout flare. Thanks!

## 2021-03-21 NOTE — Telephone Encounter (Signed)
Talked to pt. Stated he was just here on Monday and he works 10 -11 hrs a day; unable to schedule an appt this week nor next week; suggested telehealth, he stated his job does not like for him to be on the phone. I stated about titrating up allopurinol; he stated this was done about 2 -3 weeks ago; waiting to see how it does. He stated every time he gets a new doctor; everything gets confused. Stated his doctor told him "do not run out".

## 2021-03-21 NOTE — Telephone Encounter (Signed)
Talked to pt. He explains he's not tapering the  prednisone; stated he was prescribed 20 tabs the last time - after he starts taking the med and the pain eases up, he stops taking it b/c he does not want to run out. States when the pain hits him, he needs something right then; he can't wait 2 -3 days for a refill then he misses work. He states the allopurinol helps; he does not have to take as much or as often of the prednisone. States his doctor (Dr Ephriam Knuckles) told him she will not let him run out. He's not having a flare-up at the moment but it can happen at any time. Currently he has 2 pills left. Thanks

## 2021-03-21 NOTE — Telephone Encounter (Signed)
I have reviewed his chart and he seems that he is been on prednisone for a while, longer than advised. Plan at the last visit was to taper and with no refills. He likely needs up titration of his allopurinol. Can we have him come in for a visit if he is still having so much pain? Thanks!

## 2021-03-21 NOTE — Telephone Encounter (Signed)
I think something was mis communicated because all the notes discuss prednisone tapering. His most recent uric acid was below 6 meaning he should be on the appropriate allopurinol dose now. If he is still having pain in his foot and feeling that he needs prednisone I would certainly want to see him and re-evaluate him in clinic. I do not feel comfortable with refilling steroids at this point as he should not need them this frequently for gout that is being treated appropriately. I would be happy to call him at a time that works best for him if that is easier than coming in but in person would be best to evaluate his pain. Thanks

## 2021-03-21 NOTE — Telephone Encounter (Signed)
Please call pt back regarding refill on  predniSONE (DELTASONE) 20 MG tablet. Pt states he still have not received a phone call from the clinic since 03/19/2021. Please call pt back.

## 2021-03-22 ENCOUNTER — Other Ambulatory Visit: Payer: Self-pay

## 2021-03-22 ENCOUNTER — Ambulatory Visit (INDEPENDENT_AMBULATORY_CARE_PROVIDER_SITE_OTHER): Payer: Self-pay | Admitting: Internal Medicine

## 2021-03-22 DIAGNOSIS — M109 Gout, unspecified: Secondary | ICD-10-CM

## 2021-03-22 MED ORDER — PREDNISONE 20 MG PO TABS: ORAL_TABLET | ORAL | 0 refills | Status: DC

## 2021-03-22 NOTE — Telephone Encounter (Signed)
Called pt - no answer; left message to call me back at the office.

## 2021-03-22 NOTE — Telephone Encounter (Signed)
Talked to pt - informed of Dr Patty Sermons response; informed uric acid level is below 6 and he's on appropriate dose of allopurinol. But he insists he needs Prednisone, currently not having a flare-up. Stated sometimes when he does, he's unable to drive. And he can't wait 2 -3 days for rx and miss work. He's agreeable to telehealth appt for today; he will inform his boss. Appt schedule with Dr Mcarthur Rossetti @ [phone]; inform the doctor may call sooner.

## 2021-03-22 NOTE — Progress Notes (Signed)
  [facility] Internal Medicine Residency Telephone Encounter Continuity Care Appointment  HPI:   This telephone encounter was created for Mr. Tara Wich on 03/22/2021 for the following purpose/cc discussion regarding prednisone therapy for gout.    Past Medical History:  Past Medical History:  Diagnosis Date  . Essential hypertension   . Gout       ROS:   Negative except as stated in HPI.   Assessment / Plan / Recommendations:   Please see A&P under problem oriented charting for assessment of the patient's acute and chronic medical conditions.   As always, pt is advised that if symptoms worsen or new symptoms arise, they should go to an urgent care facility or to to ER for further evaluation.   Consent and Medical Decision Making:   Patient discussed with Dr. Heide Spark  This is a telephone encounter between Kerin Perna and Tonique Mendonca on 03/22/2021 for discussion of prednisone therapy for acute gout flares. The visit was conducted with the patient located at home and Tenino Alvaretta Eisenberger at [facility]. The patient's identity was confirmed using their DOB and current address. The patient has consented to being evaluated through a telephone encounter and understands the associated risks (an examination cannot be done and the patient may need to come in for an appointment) / benefits (allows the patient to remain at home, decreasing exposure to coronavirus). I personally spent 8 minutes on medical discussion.

## 2021-03-23 NOTE — Assessment & Plan Note (Signed)
Patient with a history of gout noted to have frequent flares in the past that were mostly treated with prednisone tapers. He was recently started on allopurinol with dosage increased at his last visit for increased uric acid. Patient is calling to request refill on prednisone as needed. He notes that he has not had a flare in 2 weeks but would like to have some in the setting that he were to have a flare. He notes that when he has a flare, he is unable to drive due to excruciating pain. Last flare was approximately two weeks prior. I suspect this was secondary to increasing the allopurinol. No current s/s of flare. Most recent uric acid 5.7  Plan: Prednisone prn sent to pharmacy Patient advised to inform clinic whenever he has a flare so we are able to keep track of frequency of flares. If continues to have frequent flares despite increased allopurinol and uric acid at goal (<6), can consider addition of colchicine therapy.

## 2021-03-26 NOTE — Progress Notes (Signed)
Internal Medicine Clinic Attending ° °Case discussed with Dr. Aslam  At the time of the visit.  We reviewed the resident’s history and exam and pertinent patient test results.  I agree with the assessment, diagnosis, and plan of care documented in the resident’s note.  °

## 2021-04-11 ENCOUNTER — Telehealth: Payer: Self-pay

## 2021-04-11 DIAGNOSIS — M109 Gout, unspecified: Secondary | ICD-10-CM

## 2021-04-11 NOTE — Telephone Encounter (Signed)
Please review telephone note from 4/4 and office visit notes from 4/7. Please advise. Thank you! SChaplin, RN,BSN

## 2021-04-11 NOTE — Telephone Encounter (Signed)
RTC, VM obtained, no message left as VM was not self-identifying SChaplin, RN,BSN

## 2021-04-11 NOTE — Telephone Encounter (Signed)
Is patient having another flare up or just requesting a refill to have in case? Would like to see him in clinic if possible to address gout treatment and flare ups. Thanks!

## 2021-04-11 NOTE — Telephone Encounter (Signed)
predniSONE (DELTASONE) 20 MG tablet, refill request @  [id number] - [place], [place] - [address] AT [facility] OF [address] Phone:  [phone]  Fax:  [phone]

## 2021-04-12 NOTE — Telephone Encounter (Signed)
Pt called back and states he had a small flare where he used some of the prednisone and is down to 3 tabs.  He is requesting a refill to have on hand in the event he needs it, he states "he has to go to work and can't miss work.  I work from 11-8, 6 days a week".  RN informed patient MD was requesting to see him in clinic if possible.  Patient states he was just in clinic and had labs to address this.  He does not want to miss work again.  He states his doctor was giving him 40 tablets and now he is only getting 20 tabs which is why he is having to call for refills so much.  Forwarding to blue team to advise. SChaplin, RN,BSN

## 2021-04-13 NOTE — Telephone Encounter (Signed)
I do not recommend he keep taking prednisone for flair ups. It seems these risks have been discussed with him. I understand he works 6 days a week but we would need to check his uric acid in between flare ups and possible up titrate his allopurinol and consider alternatives due to how frequently he is taking prednisone. I am not sure how to better explain this to him as this seems to be an ongoing issue. I will also talk to the attendings but his last appt was a telehealth appt. We would need to see him or at the very least get a repeat uric acid level on him. If he is having this significant of flares he may need to see rheum as well.

## 2021-04-15 ENCOUNTER — Other Ambulatory Visit: Payer: Self-pay

## 2021-04-15 ENCOUNTER — Ambulatory Visit (HOSPITAL_COMMUNITY)
Admission: EM | Admit: 2021-04-15 | Discharge: 2021-04-15 | Disposition: A | Discharge status: Home / Self Care | Payer: Self-pay | Attending: Urgent Care | Admitting: Urgent Care

## 2021-04-15 ENCOUNTER — Ambulatory Visit (INDEPENDENT_AMBULATORY_CARE_PROVIDER_SITE_OTHER): Payer: Self-pay

## 2021-04-15 ENCOUNTER — Encounter (HOSPITAL_COMMUNITY): Payer: Self-pay

## 2021-04-15 DIAGNOSIS — M25561 Pain in right knee: Secondary | ICD-10-CM

## 2021-04-15 DIAGNOSIS — Z09 Encounter for follow-up examination after completed treatment for conditions other than malignant neoplasm: Secondary | ICD-10-CM

## 2021-04-15 DIAGNOSIS — Z8739 Personal history of other diseases of the musculoskeletal system and connective tissue: Secondary | ICD-10-CM

## 2021-04-15 MED ORDER — PREDNISONE 10 MG PO TABS: 30.0000 mg | ORAL_TABLET | Freq: Every day | ORAL | 0 refills | Status: DC

## 2021-04-15 NOTE — ED Provider Notes (Signed)
[facility]   MRN: [id number] DOB: 1976/03/03  Subjective:   Lucas Morse is a 45 y.o. male presenting for 1 day history of acute onset moderate to severe persistent and constant right knee pain that is deep and internal.  Denies fall, trauma, numbness or tingling, weakness, knee buckling.  Patient states that he has a history of gout and usually does very well with prednisone.  He is requesting this today.  No history of diabetes.  He does have high blood pressure and is compliant with his medications.  No current facility-administered medications for this encounter.  Current Outpatient Medications:  .  allopurinol (ZYLOPRIM) 100 MG tablet, Take 3 tablets (300 mg total) by mouth daily., Disp: 90 tablet, Rfl: 2 .  amLODipine (NORVASC) 10 MG tablet, Take 1 tablet (10 mg total) by mouth daily., Disp: 30 tablet, Rfl: 11 .  losartan (COZAAR) 50 MG tablet, TAKE 1 TABLET(50 MG) BY MOUTH DAILY, Disp: 30 tablet, Rfl: 1 .  metoprolol tartrate (LOPRESSOR) 50 MG tablet, TAKE 1 TABLET(50 MG) BY MOUTH TWICE DAILY, Disp: 60 tablet, Rfl: 1 .  predniSONE (DELTASONE) 20 MG tablet, Take 2 tablets daily until symptoms have improved. Then begin tapering your dose. Take 1.5 tablets for 3 days, then 1 tablet for 3 days, then 0.5 tablet for 3 days., Disp: 20 tablet, Rfl: 0   No Known Allergies  Past Medical History:  Diagnosis Date  . Essential hypertension   . Gout      Past Surgical History:  Procedure Laterality Date  . EYE SURGERY      Family History  Problem Relation Age of Onset  . HIV/AIDS Mother   . Stomach cancer Paternal Grandmother   . Gout Maternal Uncle     Social History   Tobacco Use  . Smoking status: Current Every Day Smoker    Packs/day: 1.00    Types: Cigarettes  . Smokeless tobacco: Never Used  . Tobacco comment: cutting back  Substance Use Topics  . Alcohol use: Yes    Alcohol/week: 0.0 standard drinks  . Drug use: No    ROS   Objective:    Vitals: BP (!) 154/98 (BP Location: Right Arm)   Pulse 92   Temp 98.6 F (37 C)   Resp 18   SpO2 100%   Physical Exam Constitutional:      General: He is not in acute distress.    Appearance: Normal appearance. He is well-developed and normal weight. He is not ill-appearing, toxic-appearing or diaphoretic.  HENT:     Head: Normocephalic and atraumatic.     Right Ear: External ear normal.     Left Ear: External ear normal.     Nose: Nose normal.     Mouth/Throat:     Pharynx: Oropharynx is clear.  Eyes:     General: No scleral icterus.       Right eye: No discharge.        Left eye: No discharge.     Extraocular Movements: Extraocular movements intact.     Pupils: Pupils are equal, round, and reactive to light.  Cardiovascular:     Rate and Rhythm: Normal rate.  Pulmonary:     Effort: Pulmonary effort is normal.  Musculoskeletal:     Cervical back: Normal range of motion.     Right knee: No swelling, deformity, effusion, erythema, ecchymosis, bony tenderness or crepitus. Decreased range of motion. Tenderness (posterior knee) present over the medial joint line, lateral joint  line and patellar tendon. Normal alignment and normal patellar mobility.  Skin:    General: Skin is warm and dry.  Neurological:     Mental Status: He is alert and oriented to person, place, and time.     Motor: No weakness.     Coordination: Coordination normal.     Gait: Gait normal.     Deep Tendon Reflexes: Reflexes normal.  Psychiatric:        Mood and Affect: Mood normal.        Behavior: Behavior normal.        Thought Content: Thought content normal.        Judgment: Judgment normal.     DG Knee Complete 4 Views Right  Result Date: 04/15/2021 CLINICAL DATA:  Knee pain EXAM: RIGHT KNEE - COMPLETE 4+ VIEW COMPARISON:  None. FINDINGS: No evidence of fracture, dislocation, or joint effusion. No evidence of arthropathy or other focal bone abnormality. Soft tissues are unremarkable.  IMPRESSION: Negative. Electronically Signed   By: Kennith Center M.D.   On: 04/15/2021 14:15     Assessment and Plan :   PDMP not reviewed this encounter.  1. Posterior right knee pain   2. History of gout     Will manage his history of gout.  Recommended low purine diet. Use oral prednisone 30mg  once daily for 5 days. Follow up with PCP. Counseled patient on potential for adverse effects with medications prescribed/recommended today, ER and return-to-clinic precautions discussed, patient verbalized understanding.    , Wallis Bamberg 04/15/21 1445

## 2021-04-15 NOTE — ED Triage Notes (Signed)
Pt present right leg pain, symptom started yesterday. Pt state he has a history of gout.

## 2021-04-16 NOTE — Telephone Encounter (Signed)
Call from pt-seen at Urgent Care yesterday for right knee pain (gout?) rx'd oral prednisone 30mg  once daily for 5 days and he will  Pt frustrated about not having "as needed" refills on hand. Pt advised to return to clinic to obtain uric acid as suggested by previous note, pt refused as he states he had labs obtained on in March and April already.  Pt also states that he cant miss work-was given work excuse for tomorrow, but he states he has to go in.  Pt would like to discuss his "situation" with someone as he stated his previous MD had told him that he could get the prednisone anytime he needs it.  Pt unable to come in for visit tomorrow.  Will send to the appropriate team to reach out to pt and address his concerns.Lucas Cassady5/2/20223:39 PM

## 2021-04-17 MED ORDER — PREDNISONE 5 MG PO TABS: ORAL_TABLET | ORAL | 0 refills | Status: AC

## 2021-04-17 NOTE — Telephone Encounter (Signed)
Patient reports he continues gout flares and frustrated because his prescriptions have ran out without a taper. I will write prescription for longer taper. Discussed long term side effects of prednisone. Placing future order for uric acid to be checked in one month. Allopurinol can be titrated up further to help control symptoms if elevated.  Last uric acid level 5.7

## 2021-04-18 ENCOUNTER — Other Ambulatory Visit: Payer: Self-pay

## 2021-04-18 DIAGNOSIS — M109 Gout, unspecified: Secondary | ICD-10-CM

## 2021-04-18 MED ORDER — ALLOPURINOL 100 MG PO TABS: 300.0000 mg | ORAL_TABLET | Freq: Every day | ORAL | 2 refills | Status: DC

## 2021-05-18 ENCOUNTER — Other Ambulatory Visit: Payer: Self-pay | Admitting: Internal Medicine

## 2021-05-18 DIAGNOSIS — I10 Essential (primary) hypertension: Secondary | ICD-10-CM

## 2021-05-18 NOTE — Telephone Encounter (Signed)
Please see phone note from 4/27. Patient last given prednisone at 5/1 visit to UC.

## 2021-05-18 NOTE — Telephone Encounter (Signed)
MED REFILL REQUEST  predniSONE (DELTASONE) 10 MG tablet  [facility] #[id number] - [place], [place] - [address] AT [facility] OF [address] Phone:  [phone]  Fax:  [phone]     Only has 4 pills left.

## 2021-05-20 NOTE — Telephone Encounter (Signed)
Refill not appropriate. Recommend obtaining repeat uric acid.

## 2021-05-21 NOTE — Telephone Encounter (Signed)
  predniSONE (DELTASONE) 10 MG tablet, refill request. Please call pt back.

## 2021-05-21 NOTE — Telephone Encounter (Signed)
Lab only is ok. Thank you

## 2021-05-21 NOTE — Telephone Encounter (Signed)
Patient called and notified of Dr. Luciana Axe instructions.  He is upset and states "he has had multiple labs already done, I am not coming in for more labwork.  This is ridiculous that I have to go through this every month.  Dr. Barbaraann Faster told me he would put something in my chart that states I can have the prednisone.  She needs to call in the prednisone, or I'm going to have to get another doctor".  RN informed patient that Dr. Ephriam Knuckles is his PCP and is recommending labs.  Pt requesting a phone call from Dr. Ephriam Knuckles because he "doesn't understand why he keeps having to come in and why he is being told different things from different doctors. He works 6 days a week and it is difficult for him to keep coming in and I am trying to stay out of the ED".  Will forward to Dr. Ephriam Knuckles.  Pt was pleasant during phone call. SChaplin, RN,BSN

## 2021-05-21 NOTE — Telephone Encounter (Signed)
Can we look into seeing if we can get him insurance? Due to the frequent gout flares, he could potentially qualify for some other treatments but he would likely need insurance.

## 2021-05-21 NOTE — Telephone Encounter (Signed)
Dr. Ephriam Knuckles, Do you want him to come in for a lab only visit or do you want to see him in your clinic as well? Thanks, [employer]

## 2021-05-22 ENCOUNTER — Ambulatory Visit (HOSPITAL_COMMUNITY)
Admission: EM | Admit: 2021-05-22 | Discharge: 2021-05-22 | Disposition: A | Discharge status: Home / Self Care | Payer: Self-pay | Attending: Physician Assistant | Admitting: Physician Assistant

## 2021-05-22 ENCOUNTER — Other Ambulatory Visit: Payer: Self-pay

## 2021-05-22 ENCOUNTER — Encounter (HOSPITAL_COMMUNITY): Payer: Self-pay | Admitting: Emergency Medicine

## 2021-05-22 DIAGNOSIS — M109 Gout, unspecified: Secondary | ICD-10-CM

## 2021-05-22 MED ORDER — PREDNISONE 20 MG PO TABS: ORAL_TABLET | ORAL | 0 refills | Status: AC

## 2021-05-22 MED ORDER — PREDNISONE 10 MG PO TABS: 30.0000 mg | ORAL_TABLET | Freq: Every day | ORAL | 1 refills | Status: DC

## 2021-05-22 NOTE — ED Provider Notes (Signed)
[facility]    CSN: [id number] Arrival date & time: 05/22/21  1327      History   Chief Complaint Chief Complaint  Patient presents with  . Gout    HPI Lucas Morse is a 45 y.o. male.   45 year old male comes in for 1 day history of right great toe pain. He has been having frequent gout flares, no specific triggers. States has significant swelling with pain. Denies injury/trauma. Denies spreading erythema, fever. States has not noticed any improvement on allopurinol, so has not been taking it. Denies frequent deli meat/seafood intake. Does drink beer during the weekend.     Past Medical History:  Diagnosis Date  . Essential hypertension   . Gout     Patient Active Problem List   Diagnosis Date Noted  . Gout 08/14/2020  . Uncontrolled Stage 2 Hypertension 08/14/2020  . Healthcare maintenance 08/14/2020    Past Surgical History:  Procedure Laterality Date  . EYE SURGERY         Home Medications    Prior to Admission medications   Medication Sig Start Date End Date Taking? Authorizing Provider  amLODipine (NORVASC) 10 MG tablet Take 1 tablet (10 mg total) by mouth daily. 09/12/20 09/12/21 Yes Christian, Rylee, MD  losartan (COZAAR) 50 MG tablet TAKE 1 TABLET(50 MG) BY MOUTH DAILY 03/08/21  Yes Christian, Rylee, MD  predniSONE (DELTASONE) 20 MG tablet Take 3 tablets (60 mg total) by mouth daily with breakfast for 3 days, THEN 2 tablets (40 mg total) daily with breakfast for 3 days, THEN 1 tablet (20 mg total) daily with breakfast for 3 days. 05/22/21 05/31/21 Yes Ethelle Ola V, PA-C  allopurinol (ZYLOPRIM) 100 MG tablet Take 3 tablets (300 mg total) by mouth daily. Patient not taking: Reported on 05/22/2021 04/18/21 05/18/21  Remo Lipps, MD  metoprolol tartrate (LOPRESSOR) 50 MG tablet TAKE 1 TABLET(50 MG) BY MOUTH TWICE DAILY Patient not taking: Reported on 05/22/2021 02/23/21   Elige Radon, MD    Family History Family History  Problem Relation Age of  Onset  . HIV/AIDS Mother   . Stomach cancer Paternal Grandmother   . Gout Maternal Uncle     Social History Social History   Tobacco Use  . Smoking status: Current Every Day Smoker    Packs/day: 1.00    Types: Cigarettes  . Smokeless tobacco: Never Used  . Tobacco comment: cutting back  Vaping Use  . Vaping Use: Never used  Substance Use Topics  . Alcohol use: Not Currently    Alcohol/week: 0.0 standard drinks  . Drug use: No     Allergies   Patient has no known allergies.   Review of Systems Review of Systems  Reason unable to perform ROS: See HPI as above.     Physical Exam Triage Vital Signs ED Triage Vitals  Enc Vitals Group     BP 05/22/21 1414 116/65     Pulse Rate 05/22/21 1414 78     Resp 05/22/21 1414 20     Temp 05/22/21 1414 99.1 F (37.3 C)     Temp Source 05/22/21 1414 Oral     SpO2 05/22/21 1414 98 %     Weight --      Height --      Head Circumference --      Peak Flow --      Pain Score 05/22/21 1457 10     Pain Loc --      Pain  Edu? --      Excl. in GC? --    No data found.  Updated Vital Signs BP 116/65 (BP Location: Right Arm)   Pulse 78   Temp 99.1 F (37.3 C) (Oral)   Resp 20   SpO2 98%    Physical Exam Constitutional:      General: He is not in acute distress.    Appearance: Normal appearance. He is well-developed. He is not toxic-appearing or diaphoretic.  HENT:     Head: Normocephalic and atraumatic.  Eyes:     Conjunctiva/sclera: Conjunctivae normal.     Pupils: Pupils are equal, round, and reactive to light.  Pulmonary:     Effort: Pulmonary effort is normal. No respiratory distress.  Musculoskeletal:     Cervical back: Normal range of motion and neck supple.     Comments: Swelling with erythema to the first MTP joint of right foot. No significant warmth. Tenderness to palpation of light touch. Decreased ROM due to pain and swelling. NVI  Skin:    General: Skin is warm and dry.  Neurological:     Mental  Status: He is alert and oriented to person, place, and time.      UC Treatments / Results  Labs (all labs ordered are listed, but only abnormal results are displayed) Labs Reviewed - No data to display  EKG   Radiology No results found.  Procedures Procedures (including critical care time)  Medications Ordered in UC Medications - No data to display  Initial Impression / Assessment and Plan / UC Course  I have reviewed the triage vital signs and the nursing notes.  Pertinent labs & imaging results that were available during my care of the patient were reviewed by me and considered in my medical decision making (see chart for details).    Will have patient take prednisone as he has had good relief with this in the past. However, did discuss worries of frequent prednisone course for gout. Patient does not recall trying NSAIDs for gout flare up in the past. Advised to use naproxen for future gout flare to see if this helps with symptoms. Return precautions given.  Final Clinical Impressions(s) / UC Diagnoses   Final diagnoses:  Acute gout involving toe of right foot, unspecified cause   ED Prescriptions    Medication Sig Dispense Auth. Provider   predniSONE (DELTASONE) 20 MG tablet Take 3 tablets (60 mg total) by mouth daily with breakfast for 3 days, THEN 2 tablets (40 mg total) daily with breakfast for 3 days, THEN 1 tablet (20 mg total) daily with breakfast for 3 days. 18 tablet Belinda Fisher, PA-C     PDMP not reviewed this encounter.   Belinda Fisher, PA-C 05/22/21 1516

## 2021-05-22 NOTE — ED Triage Notes (Signed)
Patient reports history of gout .  Pain in right great toe.  Symptoms started today.

## 2021-05-22 NOTE — Telephone Encounter (Signed)
Sharon--Good idea! I'll do that. Thanks. Stacee--while we are trying to figure the insurance stuff out, I'm just going to send in for the prednisone. If he calls back, please let me know what we are doing and that I have sent the script in! Thanks Arielis Leonhart

## 2021-05-22 NOTE — Discharge Instructions (Signed)
Start prednisone as directed. Keep hydrated, urine should be clear to pale yellow in color. Ice compress for swelling.   Next time you get a gout flare, start naproxen (aleve) 440mg  twice a day until symptoms resolve, and we can see if this is able to control the flare up.

## 2021-05-22 NOTE — Addendum Note (Signed)
Addended by: Elige Radon on: 05/22/2021 01:14 PM   Modules accepted: Orders

## 2021-05-22 NOTE — Addendum Note (Signed)
Addended by: Elige Radon on: 05/22/2021 01:31 PM   Modules accepted: Orders

## 2021-05-22 NOTE — Telephone Encounter (Signed)
Dr. Ephriam Knuckles,  Are you referring to the [facility] card and [facility]?  If so, I will forward your message to Sherlynn Stalls and [place] to see if they can mail him a packet with information on [facility] and [facility] financial assistance programs.  However, it will be the patient's responsibility to follow up with the contact people for both [facility] and [facility].  [facility] no longer provides assistance, but the information is included in the packet.   Lucas Morse, Am I explaining this correctly?  If not, if you could please assist. Thank you! Lakshya Mcgillicuddy

## 2021-05-25 ENCOUNTER — Telehealth: Payer: Self-pay | Admitting: *Deleted

## 2021-05-25 NOTE — Chronic Care Management (AMB) (Signed)
  Care Management   Outreach Note  05/25/2021 Name: Lucas Morse MRN: [id number] DOB: 11/02/1976  Referred by: Elige Radon, MD Reason for referral : Care Coordination (Initial outreach to schedule referral with RNCM and BSW was unsuccessful )   An unsuccessful telephone outreach was attempted today. The patient was referred to the case management team for assistance with care management and care coordination.   Follow Up Plan: A HIPAA compliant phone message was left for the patient providing contact information and requesting a return call. The care management team will reach out to the patient again over the next 7 days. If patient returns call to provider office, please advise to call Embedded Care Management Care Guide Gwenevere Ghazi at [phone].  Gwenevere Ghazi  Care Guide, Embedded Care Coordination [facility]  [place], [place] [address] Direct Dial: [phone] [email or web address] Website: [email or web address]

## 2021-06-01 NOTE — Chronic Care Management (AMB) (Signed)
  Care Management   Outreach Note  06/01/2021 Name: Lucas Morse MRN: [id number] DOB: 02/14/1976  Referred by: Elige Radon, MD Reason for referral : Care Coordination (Initial outreach to schedule referral with RNCM and BSW was unsuccessful )   A second unsuccessful telephone outreach was attempted today. The patient was referred to the case management team for assistance with care management and care coordination.   Follow Up Plan: A HIPAA compliant phone message was left for the patient providing contact information and requesting a return call. The care management team will reach out to the patient again over the next 7 days.  If patient returns call to provider office, please advise to call Embedded Care Management Care Guide Gwenevere Ghazi at [phone]  [facility] Guide, Embedded Care Coordination [facility] Management

## 2021-06-08 NOTE — Chronic Care Management (AMB) (Signed)
  Care Management   Note  06/08/2021 Name: Lucas Morse MRN: [id number] DOB: 12/05/1976  Lucas Morse is a 45 y.o. year old male who is a primary care patient of Christian, Rylee, MD. I reached out to [employer] by phone today in response to a referral sent by Lucas Morse's PCP,  Elige Radon, MD  Lucas Morse was given information about care management services today including:  Care management services include personalized support from designated clinical staff supervised by his physician, including individualized plan of care and coordination with other care providers 24/7 contact phone numbers for assistance for urgent and routine care needs. The patient may stop care management services at any time by phone call to the office staff.  Patient agreed to services and verbal consent obtained.   Follow up plan: Telephone appointment with care management team member scheduled for:06/25/2021  [facility] Guide, Embedded Care Coordination [facility] Management

## 2021-06-11 ENCOUNTER — Telehealth: Payer: Self-pay

## 2021-06-13 ENCOUNTER — Other Ambulatory Visit: Payer: Self-pay | Admitting: *Deleted

## 2021-06-13 MED ORDER — LOSARTAN POTASSIUM 50 MG PO TABS: ORAL_TABLET | ORAL | 1 refills | Status: DC

## 2021-06-25 ENCOUNTER — Telehealth: Payer: Self-pay

## 2021-06-25 NOTE — Telephone Encounter (Signed)
  Care Management   Outreach Note  06/25/2021 Name: Lucas Morse MRN: [id number] DOB: June 02, 1976  Referred by: Elige Radon, MD Reason for referral : No chief complaint on file.   An unsuccessful telephone outreach was attempted today. The patient was referred to the case management team for assistance with care management and care coordination.   Follow Up Plan: The care management team will reach out to the patient again over the next 7-14 days.   Jodelle Gross, RN, BSN, CCM Care Management Coordinator [facility] Internal Medicine Phone: [phone] / Fax: [phone]

## 2021-06-29 ENCOUNTER — Telehealth: Payer: Self-pay | Admitting: *Deleted

## 2021-06-29 NOTE — Chronic Care Management (AMB) (Signed)
  Care Management   Note  06/29/2021 Name: Kosisochukwu Burningham MRN: [id number] DOB: [birth year]  Mieczyslaw Stamas is a 45 y.o. year old male who is a primary care patient of Elige Radon, MD and is actively engaged with the care management team. I reached out to Jacqlyn Krauss Marcantel by phone today to assist with re-scheduling an initial visit with the RN Case Manager  Follow up plan: Unsuccessful telephone outreach attempt made. A HIPAA compliant phone message was left for the patient providing contact information and requesting a return call.  The care management team will reach out to the patient again over the next 7 days.  If patient returns call to provider office, please advise to call Embedded Care Management Care Guide Gwenevere Ghazi at [phone].  Gwenevere Ghazi  Care Guide, Embedded Care Coordination [facility] Management

## 2021-06-29 NOTE — Chronic Care Management (AMB) (Signed)
  Care Management   Note  06/29/2021 Name: Lucas Morse MRN: [id number] DOB: 11/13/1976  Lucas Morse is a 45 y.o. year old male who is a primary care patient of Ephriam Knuckles, Rylee, MD and is actively engaged with the care management team. I reached out to Jacqlyn Krauss Whalley by phone today to assist with re-scheduling an initial visit with the RN Case Manager  Follow up plan: Telephone appointment with care management team member scheduled for:07/04/21  [facility] Guide, Embedded Care Coordination [facility]  Care Management

## 2021-07-04 ENCOUNTER — Ambulatory Visit: Payer: Self-pay

## 2021-07-04 NOTE — Chronic Care Management (AMB) (Signed)
Care Management    RN Visit Note  07/04/2021 Name: Lucas Morse MRN: [id number] DOB: 1976-12-11  Subjective: Lucas Morse is a 45 y.o. year old male who is a primary care patient of Christian, Rylee, MD. The care management team was consulted for assistance with disease management and care coordination needs.    Engaged with patient by telephone for initial visit in response to provider referral for case management and/or care coordination services.   Consent to Services:   Lucas Morse was given information about Care Management services today including:  Care Management services includes personalized support from designated clinical staff supervised by his physician, including individualized plan of care and coordination with other care providers 24/7 contact phone numbers for assistance for urgent and routine care needs. The patient may stop case management services at any time by phone call to the office staff.  Patient agreed to services and consent obtained.   Assessment: Review of patient past medical history, allergies, medications, health status, including review of consultants reports, laboratory and other test data, was performed as part of comprehensive evaluation and provision of chronic care management services.   SDOH (Social Determinants of Health) assessments and interventions performed:    Care Plan  No Known Allergies  Outpatient Encounter Medications as of 07/04/2021  Medication Sig   allopurinol (ZYLOPRIM) 100 MG tablet Take 3 tablets (300 mg total) by mouth daily. (Patient not taking: Reported on 05/22/2021)   amLODipine (NORVASC) 10 MG tablet Take 1 tablet (10 mg total) by mouth daily.   losartan (COZAAR) 50 MG tablet TAKE 1 TABLET(50 MG) BY MOUTH DAILY   metoprolol tartrate (LOPRESSOR) 50 MG tablet TAKE 1 TABLET(50 MG) BY MOUTH TWICE DAILY (Patient not taking: Reported on 05/22/2021)   No facility-administered encounter medications on file as of 07/04/2021.     Patient Active Problem List   Diagnosis Date Noted   Gout 08/14/2020   Uncontrolled Stage 2 Hypertension 08/14/2020   Healthcare maintenance 08/14/2020    Conditions to be addressed/monitored: HTN and Gout  Care Plan : Hypertension (Adult)  Updates made by Jodelle Gross, RN since 07/04/2021 12:00 AM     Problem: Hypertension (Hypertension)      Goal: HTN Medication and Gout Medication taken as prescribed   Start Date: 07/04/2021  This Visit's Progress: On track  Note:   Current Barriers: Successful call to patient and we discussed his HTN and Gout. Patient works 6 days a week from 8-5 and when he has a gout flare, and he is not on Prednisone, he is in too much pain to work.  Educated the patient on side effects of Prednisone and he feels he knows the side effects.  He states he only takes the prednisone for a few days, only when he needs it, to get the flair to calm down.  He is concerned his doctors at the clinic do not want to prescribe him Prednisone in the future. Does not adhere to prescribed medication regimen- Discussed medications with patient, he is taking all medications as ordered.  Monitor blood pressure- Patient states his blood pressure has been fine, the last 2 readings were 116/65, 121/68 Clinical Goal(s):  Collaboration with Elige Radon, MD regarding development and update of comprehensive plan of care as evidenced by provider attestation and co-signature Inter-disciplinary care team collaboration (see longitudinal plan of care) patient will work with care management team to address care coordination and chronic disease management needs related to Disease Management Care Coordination   Interventions:  Evaluation of current treatment plan related to HTN and Gout management  and patient's adherence to plan as established by provider. Collaboration with Elige Radon, MD regarding development and update of comprehensive plan of care as evidenced by provider  attestation       and co-signature Discussed plans with patient for ongoing care management follow up and provided patient with direct contact information for care management team Self Care Activities:  Self administers medications as prescribed Attends all scheduled provider appointments Calls pharmacy for medication refills Patient Goals:  Follow Up Plan: Telephone follow up appointment with care management team member scheduled for: 60 days      Plan: Telephone follow up appointment with care management team member scheduled for:  60 days  Jodelle Gross, RN, BSN, CCM Care Management Coordinator [facility] Internal Medicine Phone: [phone] / Fax: [phone]

## 2021-07-04 NOTE — Patient Instructions (Signed)
Visit Information   Goals Addressed             This Visit's Progress    Track and Manage My Blood Pressure-Hypertension       Timeframe:  Long-Range Goal Priority:  High Start Date:                             Expected End Date:                       Follow Up Date 09/04/2021    - check blood pressure weekly    Why is this important?   You won't feel high blood pressure, but it can still hurt your blood vessels.  High blood pressure can cause heart or kidney problems. It can also cause a stroke.  Making lifestyle changes like losing a Goetze weight or eating less salt will help.  Checking your blood pressure at home and at different times of the day can help to control blood pressure.  If the doctor prescribes medicine remember to take it the way the doctor ordered.  Call the office if you cannot afford the medicine or if there are questions about it.     Notes:         The patient verbalized understanding of instructions, educational materials, and care plan provided today and declined offer to receive copy of patient instructions, educational materials, and care plan.   Telephone follow up appointment with care management team member scheduled for: 60 days  Jodelle Gross, RN, BSN, CCM Care Management Coordinator [facility] Internal Medicine Phone: [phone] / Fax: [phone]

## 2021-07-23 ENCOUNTER — Telehealth: Payer: Self-pay

## 2021-07-23 NOTE — Telephone Encounter (Signed)
Telehealth appt has been scheduled on 8/11.

## 2021-07-23 NOTE — Telephone Encounter (Signed)
Is he willing to do a telehealth visit so we can assess his symptoms?

## 2021-07-23 NOTE — Telephone Encounter (Addendum)
Prednisone, refill request @ [id number] - [place], [place] - [address] AT [facility] OF [address]  Offered pt to come in for appt for fu visit, pt refused the appt at this time. Pt states he should not come in just for med refill.

## 2021-07-23 NOTE — Telephone Encounter (Signed)
Called pt - no answer; left message on self identified vm to call the office if he decides to schedule a telehelath appt.

## 2021-07-26 ENCOUNTER — Ambulatory Visit (INDEPENDENT_AMBULATORY_CARE_PROVIDER_SITE_OTHER): Payer: Self-pay | Admitting: Internal Medicine

## 2021-07-26 ENCOUNTER — Encounter: Payer: Self-pay | Admitting: Internal Medicine

## 2021-07-26 ENCOUNTER — Telehealth: Payer: Self-pay

## 2021-07-26 DIAGNOSIS — M109 Gout, unspecified: Secondary | ICD-10-CM

## 2021-07-26 MED ORDER — PREDNISONE 20 MG PO TABS: 20.0000 mg | ORAL_TABLET | Freq: Every day | ORAL | 0 refills | Status: DC

## 2021-07-26 NOTE — Assessment & Plan Note (Signed)
Patient reports acute gout flair in left foot similar to previous flairs. He is having less flairs on allopurinol 300 mg dose. Review of Uric Acid check show uric acid 5.7 on 03/2021. He says Prednisone cost the same if he is prescribed 10 pills or 40 pills. We discussed risk of taking too much prednisone and patient endorses understanding. I will prescribe 40 pills and patient will take as described.   Assessment/Plan: Gout involving toe of left foot - predniSONE (DELTASONE) 20 MG tablet; Take 1 tablet (20 mg total) by mouth daily with breakfast. Take 20 mg twice daily for 3 days.Then, taper by 10 mg daily.  Dispense: 40 tablet; Refill: 0 - Continue Allopurinol

## 2021-07-26 NOTE — Progress Notes (Signed)
  [facility] Internal Medicine Residency Telephone Encounter Continuity Care Appointment  HPI:  This telephone encounter was created for [name] on 07/26/2021 for the following purpose/cc gout.   Past Medical History:  Past Medical History:  Diagnosis Date   Essential hypertension    Gout      ROS:  Review of Systems  Constitutional:  Negative for chills and fever.  Musculoskeletal:  Positive for joint pain. Negative for myalgias.     Assessment / Plan / Recommendations:  Please see A&P under problem oriented charting for assessment of the patient's acute and chronic medical conditions.  As always, pt is advised that if symptoms worsen or new symptoms arise, they should go to an urgent care facility or to to ER for further evaluation.   Consent and Medical Decision Making:  Patient discussed with Dr. Criselda Peaches This is a telephone encounter between [facility] and Lucas Morse on 07/26/2021 for gout flair. The visit was conducted with the patient located at home and Lucas Morse at [facility]. The patient's identity was confirmed using their DOB and current address. The patient has consented to being evaluated through a telephone encounter and understands the associated risks (an examination cannot be done and the patient may need to come in for an appointment) / benefits (allows the patient to remain at home, decreasing exposure to coronavirus). I personally spent 18 minutes on medical discussion.

## 2021-07-26 NOTE — Telephone Encounter (Signed)
Pt pharmacy is calling in regards to a medication that was just sent over .. that needs to be clarified  Olegario Messier # [phone]

## 2021-07-26 NOTE — Telephone Encounter (Signed)
Return pharmacy's call - placed on hold x 20 mins. I will call back.  Called x 3 - unable to talk to anyone.

## 2021-08-11 ENCOUNTER — Other Ambulatory Visit: Payer: Self-pay | Admitting: Student

## 2021-08-11 DIAGNOSIS — M109 Gout, unspecified: Secondary | ICD-10-CM

## 2021-08-16 NOTE — Progress Notes (Signed)
Internal Medicine Clinic Attending  Case discussed with Dr. Steen  At the time of the visit.  We reviewed the resident's history and pertinent patient test results.  I agree with the assessment, diagnosis, and plan of care documented in the resident's note.  

## 2021-08-17 ENCOUNTER — Other Ambulatory Visit: Payer: Self-pay | Admitting: *Deleted

## 2021-08-17 MED ORDER — LOSARTAN POTASSIUM 50 MG PO TABS: ORAL_TABLET | ORAL | 1 refills | Status: DC

## 2021-08-29 ENCOUNTER — Telehealth: Payer: Self-pay

## 2021-09-05 ENCOUNTER — Telehealth: Payer: Self-pay

## 2021-09-05 NOTE — Telephone Encounter (Signed)
  Care Management   Outreach Note  09/05/2021 Name: Fernandez Kenley MRN: [id number] DOB: March 10, 1976  Referred by: Elige Radon, MD Reason for referral : No chief complaint on file.   An unsuccessful telephone outreach was attempted today. The patient was referred to the case management team for assistance with care management and care coordination.   Follow Up Plan: If patient returns call to provider office, please advise to call Embedded Care Management Care Guide Stacey at [phone].  Jodelle Gross, RN, BSN, CCM Care Management Coordinator [facility] Internal Medicine Phone: [phone] / Fax: [phone]

## 2021-10-16 ENCOUNTER — Other Ambulatory Visit: Payer: Self-pay

## 2021-10-16 DIAGNOSIS — I10 Essential (primary) hypertension: Secondary | ICD-10-CM

## 2021-10-16 MED ORDER — AMLODIPINE BESYLATE 10 MG PO TABS: 10.0000 mg | ORAL_TABLET | Freq: Every day | ORAL | 4 refills | Status: DC

## 2021-11-19 ENCOUNTER — Other Ambulatory Visit: Payer: Self-pay | Admitting: *Deleted

## 2021-11-20 MED ORDER — LOSARTAN POTASSIUM 50 MG PO TABS: ORAL_TABLET | ORAL | 0 refills | Status: DC

## 2021-12-27 ENCOUNTER — Encounter: Payer: Self-pay | Admitting: Internal Medicine

## 2021-12-27 ENCOUNTER — Ambulatory Visit (INDEPENDENT_AMBULATORY_CARE_PROVIDER_SITE_OTHER): Payer: Self-pay | Admitting: Internal Medicine

## 2021-12-27 ENCOUNTER — Other Ambulatory Visit: Payer: Self-pay

## 2021-12-27 DIAGNOSIS — M109 Gout, unspecified: Secondary | ICD-10-CM

## 2021-12-27 DIAGNOSIS — I10 Essential (primary) hypertension: Secondary | ICD-10-CM

## 2021-12-27 MED ORDER — AMLODIPINE BESYLATE 10 MG PO TABS: 10.0000 mg | ORAL_TABLET | Freq: Every day | ORAL | 4 refills | Status: DC

## 2021-12-27 MED ORDER — ALLOPURINOL 100 MG PO TABS: ORAL_TABLET | ORAL | 2 refills | Status: DC

## 2021-12-27 MED ORDER — METOPROLOL TARTRATE 50 MG PO TABS: ORAL_TABLET | ORAL | 1 refills | Status: DC

## 2021-12-27 MED ORDER — LOSARTAN POTASSIUM 50 MG PO TABS: ORAL_TABLET | ORAL | 0 refills | Status: DC

## 2021-12-29 ENCOUNTER — Encounter: Payer: Self-pay | Admitting: Internal Medicine

## 2021-12-29 MED ORDER — LOSARTAN POTASSIUM 50 MG PO TABS: ORAL_TABLET | ORAL | 6 refills | Status: DC

## 2021-12-29 NOTE — Progress Notes (Signed)
° °  Office Visit   Patient ID: Lucas Morse, male    DOB: [birth year], 46 y.o.   MRN: [id number]   PCP: Elige Radon, MD   Subjective:   Lucas Morse is a 46 y.o. year old male who presents for follow up chronic medical conditions including gout and hypertension. He has no acute complaints at today's visit.  Objective:   BP 131/72 (BP Location: Right Arm, Patient Position: Sitting, Cuff Size: Large)    Pulse 91    Temp 99.1 F (37.3 C) (Oral)    Resp (!) 24    Ht 6' (1.829 m)    Wt 226 lb 14.4 oz (102.9 kg)    SpO2 100% Comment: room air   BMI 30.77 kg/m  Wt Readings from Last 3 Encounters:  12/27/21 226 lb 14.4 oz (102.9 kg)  02/14/21 233 lb 6.4 oz (105.9 kg)  09/18/20 218 lb 9.6 oz (99.2 kg)    BP Readings from Last 3 Encounters:  12/27/21 131/72  05/22/21 116/65  04/15/21 (!) 154/98   General: well appearing, no distress Cardiac: RRR Pulm: breathing comfortably on room air, lungs clear  Assessment & Plan:   Problem List Items Addressed This Visit       Cardiovascular and Mediastinum   Essential hypertension    Current medications: amlodipine 10mg , losartan 50mg  daily, metoprolol 50mg  BID Blood pressure is at goal in the office today--131/72. He reports medication adherence although this is not congruent with dispense report. Last labs were 08/2020. I strongly encouraged him that we obtain labs at today's visit to check renal function and electrolytes however he continued to decline. Risks of insufficient monitoring of electrolytes and renal function while on his current antihypertensive therapy was reviewed with him, including renal failure, electrolyte disturbance and ultimately death. He is willing to accept these risks. Continue current management  Follow up 6 months      Relevant Medications   amLODipine (NORVASC) 10 MG tablet   metoprolol tartrate (LOPRESSOR) 50 MG tablet   losartan (COZAAR) 50 MG tablet     Other   Gout    Denies any gout flares in the  past 3 months. He reports significant improvement since taking allopurinol consistently. We discussed that our office is ok with providing a prednisone refill every few months if needed without an office visit however if he begins to have more frequent flares again, he will be asked to return for re-evaluation. Continue allopurinol 300mg  daily      Relevant Medications   allopurinol (ZYLOPRIM) 100 MG tablet     Return in about 6 months (around 06/26/2022).   Pt discussed with Dr. 07-17-2000, MD Internal Medicine Resident PGY-3 09/2020 Internal Medicine Residency 12/29/2021 9:06 AM

## 2021-12-29 NOTE — Assessment & Plan Note (Signed)
Denies any gout flares in the past 3 months. He reports significant improvement since taking allopurinol consistently. We discussed that our office is ok with providing a prednisone refill every few months if needed without an office visit however if he begins to have more frequent flares again, he will be asked to return for re-evaluation.  Continue allopurinol 300mg  daily

## 2021-12-29 NOTE — Assessment & Plan Note (Signed)
Current medications: amlodipine 10mg , losartan 50mg  daily, metoprolol 50mg  BID Blood pressure is at goal in the office today--131/72. He reports medication adherence although this is not congruent with dispense report. Last labs were 08/2020. I strongly encouraged him that we obtain labs at today's visit to check renal function and electrolytes however he continued to decline. Risks of insufficient monitoring of electrolytes and renal function while on his current antihypertensive therapy was reviewed with him, including renal failure, electrolyte disturbance and ultimately death. He is willing to accept these risks.  Continue current management   Follow up 6 months

## 2022-01-02 NOTE — Progress Notes (Signed)
Internal Medicine Clinic Attending  Case discussed with Dr. Christian  At the time of the visit.  We reviewed the resident's history and exam and pertinent patient test results.  I agree with the assessment, diagnosis, and plan of care documented in the resident's note.  

## 2022-02-05 ENCOUNTER — Other Ambulatory Visit: Payer: Self-pay

## 2022-02-05 DIAGNOSIS — M109 Gout, unspecified: Secondary | ICD-10-CM

## 2022-02-05 NOTE — Telephone Encounter (Signed)
Called pt to schedule an appt. He stated he was just here (12/27/21) and he does not need an appt. Stated Dr Barbaraann Faster is aware of his hx and was to informed to call when needed which is usually twice a year and he does not want to run out. Stated he has no insurance; works 6 days a week; and he pays out of pocket - so cannot afford to come back in for an appt. But asked to call Dr Barbaraann Faster who knows his situation. Thanks.

## 2022-02-05 NOTE — Telephone Encounter (Signed)
predniSONE (DELTASONE) 20 MG tablet, refill request @ [id number] - [place], [place] - [address] AT [facility] OF [address].

## 2022-02-06 MED ORDER — PREDNISONE 20 MG PO TABS: 20.0000 mg | ORAL_TABLET | Freq: Every day | ORAL | 0 refills | Status: DC

## 2022-02-06 NOTE — Telephone Encounter (Signed)
Pt called / informed of refill. 

## 2022-05-14 ENCOUNTER — Other Ambulatory Visit: Payer: Self-pay

## 2022-05-14 DIAGNOSIS — M109 Gout, unspecified: Secondary | ICD-10-CM

## 2022-05-14 MED ORDER — ALLOPURINOL 100 MG PO TABS: ORAL_TABLET | ORAL | 2 refills | Status: DC

## 2022-06-10 ENCOUNTER — Encounter: Payer: Self-pay | Admitting: *Deleted

## 2022-06-11 ENCOUNTER — Ambulatory Visit: Payer: Self-pay

## 2022-07-02 ENCOUNTER — Other Ambulatory Visit: Payer: Self-pay

## 2022-07-02 DIAGNOSIS — I10 Essential (primary) hypertension: Secondary | ICD-10-CM

## 2022-07-02 MED ORDER — METOPROLOL TARTRATE 50 MG PO TABS: ORAL_TABLET | ORAL | 1 refills | Status: DC

## 2022-08-02 ENCOUNTER — Other Ambulatory Visit: Payer: Self-pay | Admitting: *Deleted

## 2022-08-02 DIAGNOSIS — I10 Essential (primary) hypertension: Secondary | ICD-10-CM

## 2022-08-02 MED ORDER — AMLODIPINE BESYLATE 10 MG PO TABS: 10.0000 mg | ORAL_TABLET | Freq: Every day | ORAL | 3 refills | Status: DC

## 2022-08-02 NOTE — Telephone Encounter (Signed)
From the next, please call patient to schedule follow-up with his new PCP when he is in clinic

## 2022-08-21 ENCOUNTER — Ambulatory Visit (INDEPENDENT_AMBULATORY_CARE_PROVIDER_SITE_OTHER): Payer: No Payment, Other | Admitting: Clinical

## 2022-08-21 ENCOUNTER — Ambulatory Visit (INDEPENDENT_AMBULATORY_CARE_PROVIDER_SITE_OTHER): Payer: No Payment, Other | Admitting: Physician Assistant

## 2022-08-21 ENCOUNTER — Encounter (HOSPITAL_COMMUNITY): Payer: Self-pay | Admitting: Physician Assistant

## 2022-08-21 VITALS — BP 164/91 | HR 79 | Ht 72.0 in | Wt 210.4 lb

## 2022-08-21 DIAGNOSIS — F411 Generalized anxiety disorder: Secondary | ICD-10-CM | POA: Diagnosis not present

## 2022-08-21 DIAGNOSIS — F330 Major depressive disorder, recurrent, mild: Secondary | ICD-10-CM

## 2022-08-21 DIAGNOSIS — F329 Major depressive disorder, single episode, unspecified: Secondary | ICD-10-CM | POA: Insufficient documentation

## 2022-08-21 MED ORDER — HYDROXYZINE HCL 25 MG PO TABS: 25.0000 mg | ORAL_TABLET | Freq: Two times a day (BID) | ORAL | 1 refills | Status: DC | PRN

## 2022-08-21 MED ORDER — SERTRALINE HCL 100 MG PO TABS: ORAL_TABLET | ORAL | 1 refills | Status: DC

## 2022-08-21 NOTE — Progress Notes (Signed)
Comprehensive Clinical Assessment (CCA) Note  08/21/2022 Tadeo Besecker [id number]  Chief Complaint:  Chief Complaint  Patient presents with   Depression   Anxiety   Visit Diagnosis:   Mild episode of recurrent major depressive disorder Generalized anxiety disorder  Interpretive Summary:  Client is a 46 year old male presenting as a walk in to the [facility] health center for outpatient therapy services. Client reported he is referred by [facility] services for a clinical assessment. Client reported he has a history of depression and anxiety. Client reported he was receiving medication from [facility] but stopped taking them in 2021 because he felt well. Client reported beginning in January 2023 his symptoms relapsed as he slowly began the experience lack of motivation, sad mood, racing thoughts and feeling fearful of going about his day due to thoughts of worse case scenarios. Client reported he has no history of hospitalization for mental health reasons. Client reported no history of illicit substance use. Client reported good sleep and appetite.  Client presented oriented times five, appropriately dressed, and friendly. Client denied hallucinations and delusions, suicidal and homicidal ideations.  Client was screened for pain, nutrition, col[place]ia suicide severity and the following SDOH:    08/21/2022    9:28 AM  GAD 7 : Generalized Anxiety Score  Nervous, Anxious, on Edge 3  Control/stop worrying 3  Worry too much - different things 3  Trouble relaxing 2  Restless 2  Easily annoyed or irritable 2  Afraid - awful might happen 3  Total GAD 7 Score 18  Anxiety Difficulty Somewhat difficult     Flowsheet Row Office Visit from 08/21/2022 in [place]  PHQ-9 Total Score 4       Treatment recommendations:  psychiatry for medication management. Client declined individual counseling sessions at this time.  Therapist provided  information on format of appointment (virtual or face to face).   The client was advised to call back or seek an in-person evaluation if the symptoms worsen or if the condition fails to improve as anticipated before the next scheduled appointment. Client was in agreement with treatment recommendations.    CCA Biopsychosocial Intake/Chief Complaint:  Client is presenting by referral of [facility] for outpatient psychiatry ervices. Client presents with a history of depression and anxiety.  Current Symptoms/Problems: Client reported lack of motivation, depressed mood, racing thoughts, constant worry, and feeling on edge  Patient Reported Schizophrenia/Schizoaffective Diagnosis in Past: No  Strengths: willing wanting assistance for treatment  Preferences: medication management  Abilities: vocalizes symptoms and needs  Type of Services Patient Feels are Needed: psychiatry  Initial Clinical Notes/Concerns: No data recorded  Mental Health Symptoms Depression:   Change in energy/activity; Hopelessness   Duration of Depressive symptoms:  Greater than two weeks   Mania:   None   Anxiety:    Difficulty concentrating; Tension; Worrying   Psychosis:   None   Duration of Psychotic symptoms: No data recorded  Trauma:   None   Obsessions:   None   Compulsions:   None   Inattention:   None   Hyperactivity/Impulsivity:   None   Oppositional/Defiant Behaviors:   None   Emotional Irregularity:   None   Other Mood/Personality Symptoms:  No data recorded   Mental Status Exam Appearance and self-care  Stature:   Average   Weight:   Average weight   Clothing:   Casual   Grooming:   Normal   Cosmetic use:   Age  appropriate   Posture/gait:   Normal   Motor activity:   Not Remarkable   Sensorium  Attention:   Normal   Concentration:   Normal   Orientation:   X5   Recall/memory:   Normal   Affect and Mood  Affect:   Congruent    Mood:   Euthymic   Relating  Eye contact:   Normal   Facial expression:   Responsive   Attitude toward examiner:   Cooperative   Thought and Language  Speech flow:  Clear and Coherent   Thought content:   Appropriate to Mood and Circumstances   Preoccupation:   None   Hallucinations:   None   Organization:  No data recorded  [employer] of Knowledge:   Good   Intelligence:   Average   Abstraction:   Normal   Judgement:   Good   Reality Testing:   Adequate   Insight:   Good   Decision Making:   Normal   Social Functioning  Social Maturity:   Responsible   Social Judgement:   Normal   Stress  Stressors:   Transitions   Coping Ability:   Optician, dispensing Deficits:   Self-care   Supports:   Family; Friends/Service system     Religion: Religion/Spirituality Are You A Religious Person?: No  Leisure/Recreation: Leisure / Recreation Do You Have Hobbies?: Yes  Exercise/Diet: Exercise/Diet Do You Exercise?: No Have You Gained or Lost A Significant Amount of Weight in the Past Six Months?: No Do You Follow a Special Diet?: No Do You Have Any Trouble Sleeping?: No   CCA Employment/Education Employment/Work Situation: Employment / Work Copywriter, advertising Employment Situation: Unemployed (Client reported he has applied for disability)  Education: Education Last Grade Completed: 10 Did Teacher, adult education From [employer]?: No   CCA Family/Childhood History Family and Relationship History: Family history Marital status: Single Does patient have children?: Yes How many children?: 2 How is patient's relationship with their children?: 18 year old daughter and 34 yeah old son  Childhood History:  Childhood History By whom was/is the patient raised?: Grandparents Additional childhood history information: Client reported he was born in [place] but raise dby his paternal grandmother in [place]. Client reported he did see  his biological parents regularly. Patient's description of current relationship with people who raised him/her: Client reported his mother is deceased and his father is living whom he does talk to. Does patient have siblings?: Yes Number of Siblings: 6 Did patient suffer any verbal/emotional/physical/sexual abuse as a child?: No Did patient suffer from severe childhood neglect?: No Has patient ever been sexually abused/assaulted/raped as an adolescent or adult?: No Was the patient ever a victim of a crime or a disaster?: No Witnessed domestic violence?: No Has patient been affected by domestic violence as an adult?: No  Child/Adolescent Assessment:     CCA Substance Use Alcohol/Drug Use: Alcohol / Drug Use History of alcohol / drug use?: No history of alcohol / drug abuse                         ASAM's:  Six Dimensions of Multidimensional Assessment  Dimension 1:  Acute Intoxication and/or Withdrawal Potential:      Dimension 2:  Biomedical Conditions and Complications:      Dimension 3:  Emotional, Behavioral, or Cognitive Conditions and Complications:     Dimension 4:  Readiness to Change:     Dimension 5:  Relapse, Continued use, or Continued Problem Potential:     Dimension 6:  Recovery/Living Environment:     ASAM Severity Score:    ASAM Recommended Level of Treatment:     Substance use Disorder (SUD)    Recommendations for Services/Supports/Treatments: Recommendations for Services/Supports/Treatments Recommendations For Services/Supports/Treatments: Medication Management  DSM5 Diagnoses: Patient Active Problem List   Diagnosis Date Noted   Mild episode of recurrent major depressive disorder (HCC) 08/21/2022   Generalized anxiety disorder 08/21/2022   Gout 08/14/2020   Essential hypertension 08/14/2020   Healthcare maintenance 08/14/2020    Patient Centered Plan: Patient is on the following Treatment Plan(s):  Depression   Referrals to  Alternative Service(s): Referred to Alternative Service(s):   Place:   Date:   Time:    Referred to Alternative Service(s):   Place:   Date:   Time:    Referred to Alternative Service(s):   Place:   Date:   Time:    Referred to Alternative Service(s):   Place:   Date:   Time:      Collaboration of Care: Medication Management AEB client was scheduled for mediation management follow up.   Patient/Guardian was advised Release of Information must be obtained prior to any record release in order to collaborate their care with an outside provider. Patient/Guardian was advised if they have not already done so to contact the registration department to sign all necessary forms in order for [place] to release information regarding their care.   Consent: Patient/Guardian gives verbal consent for treatment and assignment of benefits for services provided during this visit. Patient/Guardian expressed understanding and agreed to proceed.   Neena Rhymes Griselda Tosh, LCSW

## 2022-08-21 NOTE — Progress Notes (Signed)
Psychiatric Initial Adult Assessment   Patient Identification: Lucas Morse MRN:  [id number] Date of Evaluation:  08/21/2022 Referral Source: Patient presents as a walk-in Chief Complaint:   Chief Complaint  Patient presents with   Establish Care   Medication Management   Visit Diagnosis:    ICD-10-CM   1. Mild episode of recurrent major depressive disorder (HCC)  F33.0 sertraline (ZOLOFT) 100 MG tablet    2. Generalized anxiety disorder  F41.1 hydrOXYzine (ATARAX) 25 MG tablet    sertraline (ZOLOFT) 100 MG tablet      History of Present Illness:    Lucas Morse is a 46 year old male with a past psychiatric history significant for depression and anxiety who presents to [facility] to establish psychiatric care and for medication management.  Patient presents to the clinic stating that his previous symptoms are coming back.  Patient states that he used to be seen at [facility] and was last seen by them back in November 2021.  Patient states that he used to be on the following medications: Hydroxyzine and Zoloft.  Since going off his medications, patient states that his symptoms are starting to resurface.  Patient endorses experiencing fear, anxiety, and depression.  Patient states that he is also scared to get up in the morning and hesitant to start his day.  Patient states that his symptoms are often accompanied by racing thoughts as well.  Patient endorses depressive episodes roughly 4 to 5 days out of the week.  Patient's depressive symptoms are characterized by the following symptoms: lack of motivation, decreased concentration, feelings of worthlessness, and irritability.  He denies feelings of sadness, feelings of guilt, hopelessness, or decreased energy.  He endorses anxiety and rates his anxiety at 9 or 10 out of 10.  Patient's current stressors include trying to find work and getting his bills paid.  Patient denies a past history of  hospitalization due to mental health.  Patient further denies past history of suicide attempt nor has he engaged in self-harm.  Patient is alert and oriented x 4, calm, cooperative, and fully engaged in conversation during the encounter.  Patient denies suicidal or homicidal ideations.  He further denies auditory or visual hallucinations and does not appear to be responding to internal/external stimuli.  Patient endorses good sleep and receives on average 6 to 7 hours of sleep each night.  Patient endorses good appetite and eats on average 3 meals per day.  Patient endorses occasional alcohol consumption.  Patient endorses tobacco use and smokes on average 8 to 10 cigarettes/day.  Patient denies illicit drug use but states that he used to use marijuana.  Associated Signs/Symptoms: Depression Symptoms:  depressed mood, anhedonia, hypersomnia, psychomotor agitation, feelings of worthlessness/guilt, anxiety, panic attacks, decreased labido, (Hypo) Manic Symptoms:  Distractibility, Flight of Ideas, Licensed conveyancer, Impulsivity, Labiality of Mood, Anxiety Symptoms:  Agoraphobia, Excessive Worry, Panic Symptoms, Social Anxiety, Specific Phobias, Psychotic Symptoms:   None PTSD Symptoms: Had a traumatic exposure:  Patient denies traumatic events in his life Had a traumatic exposure in the last month:  n/a Re-experiencing:  None Hypervigilance:  Yes Hyperarousal:  Difficulty Concentrating Increased Startle Response Irritability/Anger Avoidance:  Decreased Interest/Participation Foreshortened Future  Past Psychiatric History:  Depression Anxiety  Previous Psychotropic Medications: Yes   Substance Abuse History in the last 12 months:  No.  Consequences of Substance Abuse: Medical Consequences:  None Legal Consequences:  None Family Consequences:  None Blackouts:  None DT's: N/A Withdrawal Symptoms:   None  Past Medical History:  Past Medical History:  Diagnosis Date    Essential hypertension    Gout     Past Surgical History:  Procedure Laterality Date   EYE SURGERY      Family Psychiatric History:  Aunt (paternal) - unknown mental health disorder Cousin (paternal) - unknown mental health disorder  Family History:  Family History  Problem Relation Age of Onset   HIV/AIDS Mother    Stomach cancer Paternal Grandmother    Gout Maternal Uncle     Social History:   Social History   Socioeconomic History   Marital status: Single    Spouse name: Not on file   Number of children: Not on file   Years of education: Not on file   Highest education level: Not on file  Occupational History   Not on file  Tobacco Use   Smoking status: Every Day    Packs/day: 0.50    Types: Cigarettes   Smokeless tobacco: Never   Tobacco comments:    cutting back  Vaping Use   Vaping Use: Never used  Substance and Sexual Activity   Alcohol use: Not Currently    Alcohol/week: 0.0 standard drinks of alcohol   Drug use: No   Sexual activity: Not on file  Other Topics Concern   Not on file  Social History Narrative   Not on file   Social Determinants of Health   Financial Resource Strain: Not on file  Food Insecurity: Not on file  Transportation Needs: Not on file  Physical Activity: Not on file  Stress: Not on file  Social Connections: Not on file    Additional Social History:  Patient endorses housing and transportation.  He denies having a job at this time.  Allergies:  No Known Allergies  Metabolic Disorder Labs: No results found for: "HGBA1C", "MPG" No results found for: "PROLACTIN" No results found for: "CHOL", "TRIG", "HDL", "CHOLHDL", "VLDL", "LDLCALC" No results found for: "TSH"  Therapeutic Level Labs: No results found for: "LITHIUM" No results found for: "CBMZ" No results found for: "VALPROATE"  Current Medications: Current Outpatient Medications  Medication Sig Dispense Refill   sertraline (ZOLOFT) 100 MG tablet Patient to  take half tablet (50 mg total) for four days then continue taking full tablet (100 mg total) daily. 30 tablet 1   allopurinol (ZYLOPRIM) 100 MG tablet TAKE 3 TABLETS(300 MG) BY MOUTH DAILY 90 tablet 2   amLODipine (NORVASC) 10 MG tablet Take 1 tablet (10 mg total) by mouth daily. 90 tablet 3   hydrOXYzine (ATARAX) 25 MG tablet Take 1 tablet (25 mg total) by mouth 2 (two) times daily as needed. 60 tablet 1   losartan (COZAAR) 50 MG tablet TAKE 1 TABLET(50 MG) BY MOUTH DAILY 30 tablet 6   metoprolol tartrate (LOPRESSOR) 50 MG tablet TAKE 1 TABLET(50 MG) BY MOUTH TWICE DAILY 60 tablet 1   predniSONE (DELTASONE) 20 MG tablet Take 1 tablet (20 mg total) by mouth daily with breakfast. Take 20 mg twice daily for 3 days.Then, taper by 10 mg daily. 40 tablet 0   No current facility-administered medications for this visit.    Musculoskeletal: Strength & Muscle Tone: within normal limits Gait & Station: normal Patient leans: N/A  Psychiatric Specialty Exam: Review of Systems  Psychiatric/Behavioral:  Negative for decreased concentration, dysphoric mood, hallucinations, self-injury, sleep disturbance and suicidal ideas. The patient is nervous/anxious. The patient is not hyperactive.     Blood pressure (!) 164/91, pulse 79, height 6' (1.829  m), weight 210 lb 6.4 oz (95.4 kg), SpO2 100 %.Body mass index is 28.54 kg/m.  General Appearance: Fairly Groomed  Eye Contact:  Good  Speech:  Clear and Coherent and Normal Rate  Volume:  Normal  Mood:  Anxious and Depressed  Affect:  Congruent and Depressed  Thought Process:  Coherent, Goal Directed, and Descriptions of Associations: Intact  Orientation:  Full (Time, Place, and Person)  Thought Content:  WDL  Suicidal Thoughts:  No  Homicidal Thoughts:  No  Memory:  Immediate;   Good Recent;   Good Remote;   Good  Judgement:  Good  Insight:  Good  Psychomotor Activity:  Normal  Concentration:  Concentration: Good and Attention Span: Good  Recall:   Good  Fund of Knowledge:Good  Language: Good  Akathisia:  No  Handed:  Right  AIMS (if indicated):  not done  Assets:  Communication Skills Desire for Improvement Housing Transportation  ADL's:  Intact  Cognition: WNL  Sleep:  Good   Screenings: GAD-7    Flowsheet Row Office Visit from 08/21/2022 in [facility]  Total GAD-7 Score 18      PHQ2-9    Flowsheet Row Office Visit from 08/21/2022 in [facility] Office Visit from 12/27/2021 in [facility] Office Visit from 02/14/2021 in [facility] Office Visit from 09/11/2020 in [facility] Office Visit from 08/14/2020 in [facility]  PHQ-2 Total Score 3 0 0 2 0  PHQ-9 Total Score 4 -- -- 7 0      Flowsheet Row Office Visit from 08/21/2022 in [facility] from 05/22/2021 in [facility] ED from 04/15/2021 in [facility] RISK CATEGORY No Risk No Risk No Risk       Assessment and Plan:   Lucas Morse is a 46 year old male with a past psychiatric history significant for depression and anxiety who presents to [facility] to establish psychiatric care and for medication management.  Patient presents today due to his past symptoms of anxiety and depression resurfacing.  Patient used to take hydroxyzine and Zoloft when being seen by [facility].  Patient is interested in being placed back on his medications.  Patient to be placed on hydroxyzine 25 mg 2 times daily as needed for the management of anxiety.  Patient to also be placed on Zoloft 50 mg for 4 days followed by 100 mg daily for the management of his depressive symptoms and anxiety.  Patient's medications to be prescribed to pharmacy of choice.  Collaboration of Care: Medication Management AEB provider managing  patient's psychiatric medications and Psychiatrist AEB patient being seen by a psychiatric provider  Patient/Guardian was advised Release of Information must be obtained prior to any record release in order to collaborate their care with an outside provider. Patient/Guardian was advised if they have not already done so to contact the registration department to sign all necessary forms in order for [place] to release information regarding their care.   Consent: Patient/Guardian gives verbal consent for treatment and assignment of benefits for services provided during this visit. Patient/Guardian expressed understanding and agreed to proceed.   1. Mild episode of recurrent major depressive disorder (HCC)  - sertraline (ZOLOFT) 100 MG tablet; Patient to take half tablet (50 mg total) for four days then continue taking full tablet (100 mg total)  daily.  Dispense: 30 tablet; Refill: 1  2. Generalized anxiety disorder  - hydrOXYzine (ATARAX) 25 MG tablet; Take 1 tablet (25 mg total) by mouth 2 (two) times daily as needed.  Dispense: 60 tablet; Refill: 1 - sertraline (ZOLOFT) 100 MG tablet; Patient to take half tablet (50 mg total) for four days then continue taking full tablet (100 mg total) daily.  Dispense: 30 tablet; Refill: 1  Patient to follow-up in 6 weeks Provider spent a total of 35 minutes with the patient/reviewing patient's chart  Meta Hatchet, PA 9/6/20236:26 PM

## 2022-09-20 ENCOUNTER — Other Ambulatory Visit: Payer: Self-pay | Admitting: Student

## 2022-09-20 DIAGNOSIS — M109 Gout, unspecified: Secondary | ICD-10-CM

## 2022-09-20 MED ORDER — PREDNISONE 20 MG PO TABS
20.0000 mg | ORAL_TABLET | Freq: Every day | ORAL | 0 refills | Status: DC
Start: 2022-09-20 — End: 2023-06-12

## 2022-09-20 NOTE — Telephone Encounter (Signed)
Pt called back asking for quantity of 40 pills and needed to cancel future appointment on 09/23/2022, because he is out of town.  Per his request please review previous PCP notes on the necessity of this medications.

## 2022-09-20 NOTE — Telephone Encounter (Signed)
MED REFILL REQUEST  predniSONE (DELTASONE) 20 MG tablet     [id number] - [place], [place] [address] Phone: [phone]  Fax: [phone]

## 2022-09-23 ENCOUNTER — Encounter: Payer: Self-pay | Admitting: Student

## 2022-10-09 ENCOUNTER — Encounter (HOSPITAL_COMMUNITY): Payer: No Payment, Other | Admitting: Physician Assistant

## 2022-10-11 ENCOUNTER — Other Ambulatory Visit: Payer: Self-pay | Admitting: Student

## 2022-10-11 ENCOUNTER — Other Ambulatory Visit (HOSPITAL_COMMUNITY): Payer: Self-pay | Admitting: Physician Assistant

## 2022-10-11 DIAGNOSIS — M109 Gout, unspecified: Secondary | ICD-10-CM

## 2022-10-11 DIAGNOSIS — F330 Major depressive disorder, recurrent, mild: Secondary | ICD-10-CM

## 2022-10-11 DIAGNOSIS — F411 Generalized anxiety disorder: Secondary | ICD-10-CM

## 2022-10-11 DIAGNOSIS — I10 Essential (primary) hypertension: Secondary | ICD-10-CM

## 2022-10-11 MED ORDER — AMLODIPINE BESYLATE 10 MG PO TABS: 10.0000 mg | ORAL_TABLET | Freq: Every day | ORAL | 3 refills | Status: DC

## 2022-10-11 MED ORDER — SERTRALINE HCL 100 MG PO TABS: ORAL_TABLET | ORAL | 1 refills | Status: DC

## 2022-10-11 MED ORDER — ALLOPURINOL 100 MG PO TABS: ORAL_TABLET | ORAL | 2 refills | Status: DC

## 2022-10-11 MED ORDER — LOSARTAN POTASSIUM 50 MG PO TABS: ORAL_TABLET | ORAL | 6 refills | Status: DC

## 2022-10-11 MED ORDER — METOPROLOL TARTRATE 50 MG PO TABS: ORAL_TABLET | ORAL | 1 refills | Status: DC

## 2022-10-11 MED ORDER — HYDROXYZINE HCL 25 MG PO TABS: 25.0000 mg | ORAL_TABLET | Freq: Two times a day (BID) | ORAL | 1 refills | Status: DC | PRN

## 2022-11-14 ENCOUNTER — Encounter: Payer: Self-pay | Admitting: Internal Medicine

## 2022-11-14 ENCOUNTER — Ambulatory Visit (INDEPENDENT_AMBULATORY_CARE_PROVIDER_SITE_OTHER): Payer: Self-pay | Admitting: Internal Medicine

## 2022-11-14 ENCOUNTER — Other Ambulatory Visit: Payer: Self-pay

## 2022-11-14 VITALS — BP 127/63 | HR 70 | Temp 98.9°F | Ht 72.0 in | Wt 219.5 lb

## 2022-11-14 DIAGNOSIS — I10 Essential (primary) hypertension: Secondary | ICD-10-CM

## 2022-11-14 DIAGNOSIS — F17210 Nicotine dependence, cigarettes, uncomplicated: Secondary | ICD-10-CM

## 2022-11-14 DIAGNOSIS — F330 Major depressive disorder, recurrent, mild: Secondary | ICD-10-CM

## 2022-11-14 DIAGNOSIS — F411 Generalized anxiety disorder: Secondary | ICD-10-CM

## 2022-11-14 DIAGNOSIS — M109 Gout, unspecified: Secondary | ICD-10-CM

## 2022-11-14 NOTE — Patient Instructions (Signed)
Mr. Genet,  It was a pleasure to care for you today!  I will let you know what your lab work shows.   I would like you to follow up in about 6 months, or sooner if needed.  My best, Dr. August Saucer

## 2022-11-14 NOTE — Progress Notes (Signed)
   CC: routine follow up  HPI:  [name] is a 46 y.o. person with past medical history as detailed below who presents today for routine check up. Please see problem based charting for detailed assessment and plan.   Past Medical History:  Diagnosis Date   Essential hypertension    Gout    Review of Systems:  Negative unless otherwise stated.  Physical Exam:  Vitals:   11/14/22 1504  BP: 127/63  Pulse: 70  Temp: 98.9 F (37.2 C)  TempSrc: Oral  SpO2: 99%  Weight: 219 lb 8 oz (99.6 kg)  Height: 6' (1.829 m)   Constitutional:Appears well, in no acute distress. Cardio:Regular rate and rhythm. No murmurs, rubs, or gallops. Pulm:Clear to auscultation bilaterally. Normal work of breathing on room air. NAT:[id number] for extremity edema. Skin:Warm and dry. Neuro:Alert and oriented x3. No focal deficit noted. Psych:Pleasant mood and affect.  Assessment & Plan:   See Encounters Tab for problem based charting.  Essential hypertension BP at goal today, 127/63. Current regimen is amlodipine 10 mg daily, losartan 50 mg daily, metoprolol 50 mg daily.  Plan:Continue current regimen.  Generalized anxiety disorder GAD-7 is 18, PHQ-9 is 10. Current regimen is zoloft 100 mg daily which he states is helping his symptoms in addition to hydroxyzine 25 mg twice daily as needed.  He states that his anxiety is worse in the morning when he first wakes up and is getting going for the day.  He clarifies that despite having a high GAD-7 score, his anxiety fluctuates day-to-day and he does feel like the medication is helping.  He was recently seen by psychiatry who tried him on BuSpar but patient says that he did not like the medication nor did he get refills after the initial prescription.  He is not taking this medication at this time.  He has several social determinants of health that are positive over the last 12 months including having stayed outside, in the car, and intent, and overnight  shelter, or temporarily insulin as his home, worry about losing his housing, difficulty with paying for basic needs, and being threatened to have his utilities turned off. Plan: Continue sertraline 100 mg daily and hydroxyzine 25 mg twice daily as needed.  Can increase the dose of sertraline in the future if he no longer feels a good response to current dose, or consider changing his regimen.  Gout Patient is on allopurinol 300 mg daily for maintenance therapy. Uric acid last checked 03/2021 was 5.7.  BMP last checked in September 2021.  He does endorse a gout flare earlier this week but says that it only lasted 1 day as he had a quick prescription sent in for prednisone which takes care of his gout symptoms almost immediately.  Patient has been noted to be quite hesitant in the past to having labs obtained.  I suspect this is related to both financial constraints as well as fear of being stuck with a needle. Plan: Patient agreed to have BMP and uric acid level checked today.  I explained to him that in order to continue prescribing allopurinol, a BMP would have to be checked.  In the future, especially now that we will have a updated BMP, I would recommend sending in an NSAID for acute flare or colchicine rather than prednisone.  Patient discussed with Dr. Antony Contras

## 2022-11-15 MED ORDER — SERTRALINE HCL 100 MG PO TABS: ORAL_TABLET | ORAL | 1 refills | Status: DC

## 2022-11-15 NOTE — Assessment & Plan Note (Signed)
GAD-7 is 18, PHQ-9 is 10. Current regimen is zoloft 100 mg daily which he states is helping his symptoms in addition to hydroxyzine 25 mg twice daily as needed.  He states that his anxiety is worse in the morning when he first wakes up and is getting going for the day.  He clarifies that despite having a high GAD-7 score, his anxiety fluctuates day-to-day and he does feel like the medication is helping.  He was recently seen by psychiatry who tried him on BuSpar but patient says that he did not like the medication nor did he get refills after the initial prescription.  He is not taking this medication at this time.  He has several social determinants of health that are positive over the last 12 months including having stayed outside, in the car, and intent, and overnight shelter, or temporarily insulin as his home, worry about losing his housing, difficulty with paying for basic needs, and being threatened to have his utilities turned off. Plan: Continue sertraline 100 mg daily and hydroxyzine 25 mg twice daily as needed.  Can increase the dose of sertraline in the future if he no longer feels a good response to current dose, or consider changing his regimen.

## 2022-11-15 NOTE — Assessment & Plan Note (Signed)
Patient is on allopurinol 300 mg daily for maintenance therapy. Uric acid last checked 03/2021 was 5.7.  BMP last checked in September 2021.  He does endorse a gout flare earlier this week but says that it only lasted 1 day as he had a quick prescription sent in for prednisone which takes care of his gout symptoms almost immediately.  Patient has been noted to be quite hesitant in the past to having labs obtained.  I suspect this is related to both financial constraints as well as fear of being stuck with a needle. Plan: Patient agreed to have BMP and uric acid level checked today.  I explained to him that in order to continue prescribing allopurinol, a BMP would have to be checked.  In the future, especially now that we will have a updated BMP, I would recommend sending in an NSAID for acute flare or colchicine rather than prednisone.

## 2022-11-15 NOTE — Assessment & Plan Note (Signed)
BP at goal today, 127/63. Current regimen is amlodipine 10 mg daily, losartan 50 mg daily, metoprolol 50 mg daily.  Plan:Continue current regimen.

## 2022-11-16 LAB — BMP8+ANION GAP
Anion Gap: 18 mmol/L (ref 10.0–18.0)
BUN/Creatinine Ratio: 20 (ref 9–20)
BUN: 18 mg/dL (ref 6–24)
CO2: 19 mmol/L — ABNORMAL LOW (ref 20–29)
Calcium: 10.2 mg/dL (ref 8.7–10.2)
Chloride: 101 mmol/L (ref 96–106)
Creatinine, Ser: 0.9 mg/dL (ref 0.76–1.27)
Glucose: 83 mg/dL (ref 70–99)
Potassium: 4.4 mmol/L (ref 3.5–5.2)
Sodium: 138 mmol/L (ref 134–144)
eGFR: 106 mL/min/{1.73_m2} (ref >59)

## 2022-11-16 LAB — URIC ACID: Uric Acid: 6.1 mg/dL (ref 3.8–8.4)

## 2022-11-19 ENCOUNTER — Telehealth: Payer: Self-pay | Admitting: Internal Medicine

## 2022-11-19 DIAGNOSIS — M109 Gout, unspecified: Secondary | ICD-10-CM

## 2022-11-19 MED ORDER — ALLOPURINOL 100 MG PO TABS: 400.0000 mg | ORAL_TABLET | Freq: Every day | ORAL | 2 refills | Status: DC

## 2022-11-19 NOTE — Telephone Encounter (Signed)
Phone call to patient to discuss lab results and medication adjustments with no answer. I left him a message asking that he call the clinic back, and informing him that I would also send an update to his MyChart.  Champ Mungo, DO

## 2022-11-22 NOTE — Progress Notes (Signed)
Internal Medicine Clinic Attending ? ?Case discussed with Dr. Dean  At the time of the visit.  We reviewed the resident?s history and exam and pertinent patient test results.  I agree with the assessment, diagnosis, and plan of care documented in the resident?s note.  ?

## 2022-12-08 IMAGING — DX DG KNEE COMPLETE 4+V*R*
4 series · 4 of 4 positions shown · non-contrast
Comparison: None.

CLINICAL DATA: Knee pain

EXAM:
RIGHT KNEE - COMPLETE 4+ VIEW

[knee ap (1 of 2)]
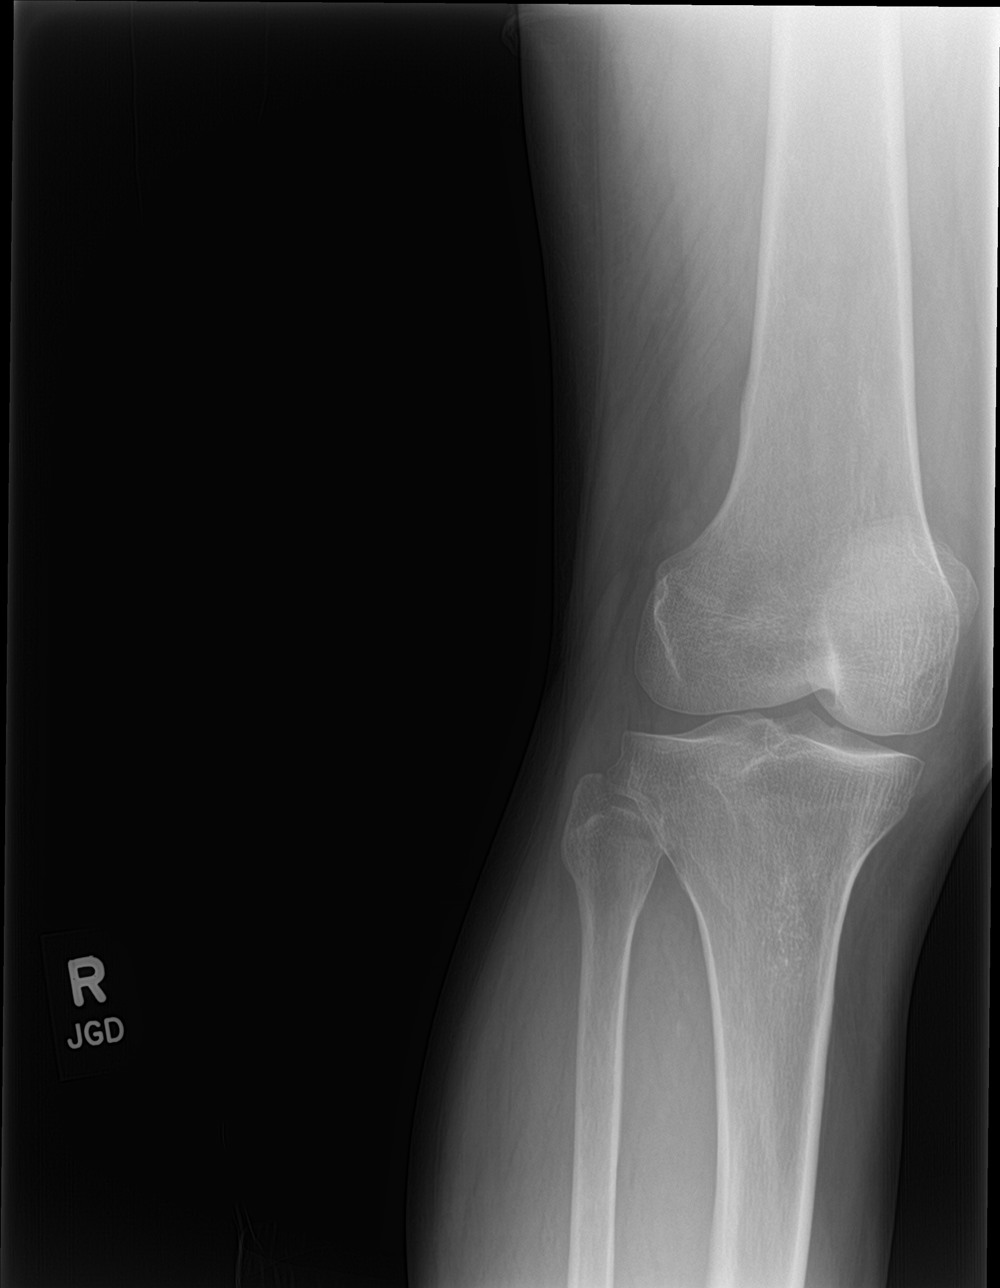

[knee lat]
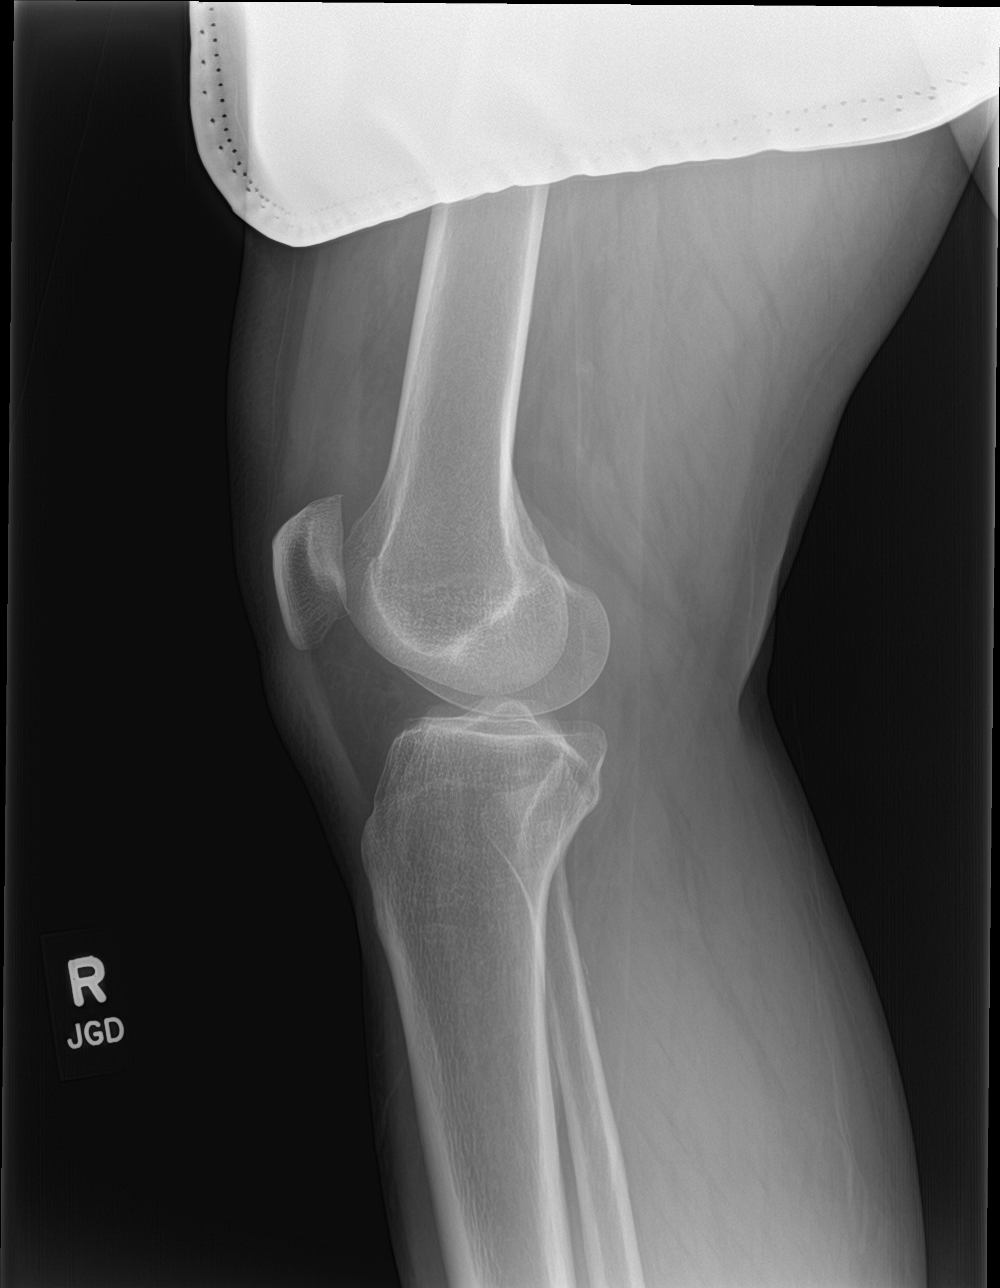

[knee sunrise]
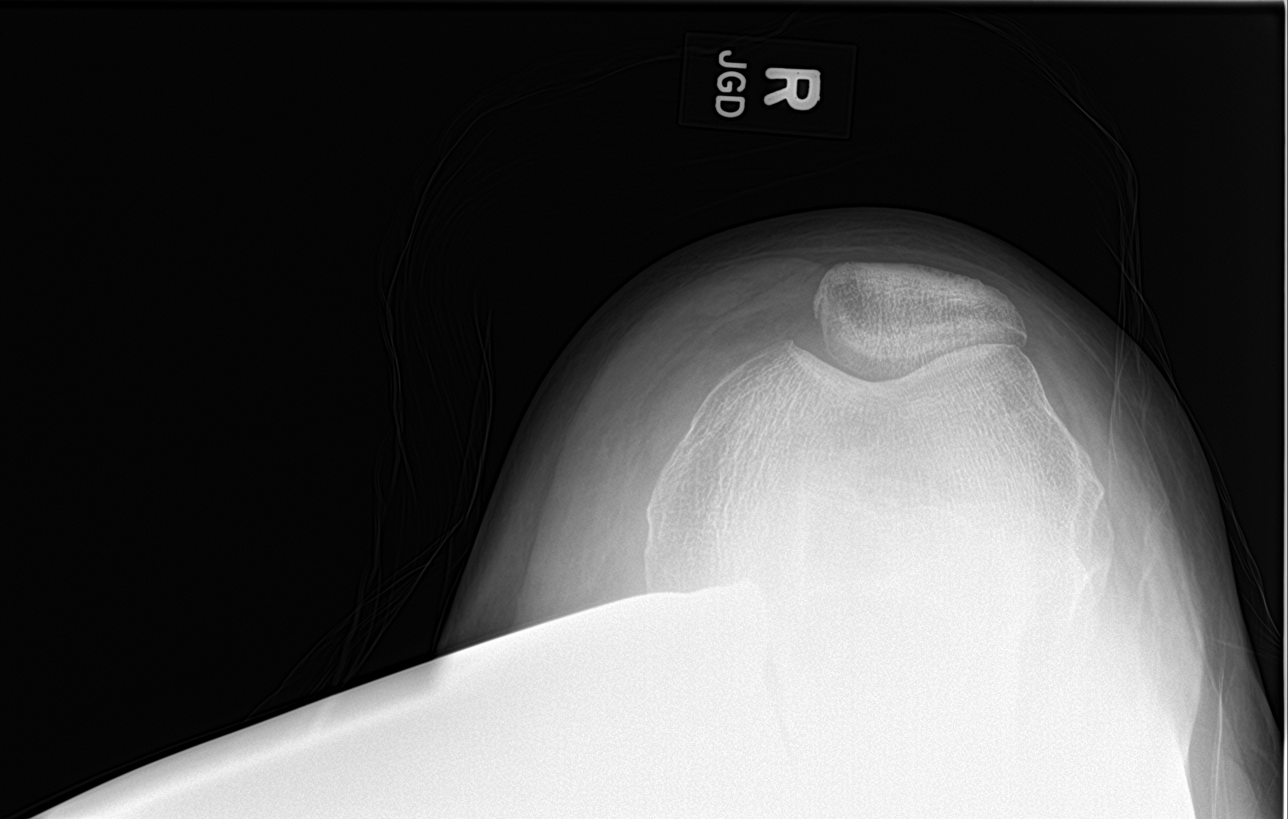

[knee ap (2 of 2)]
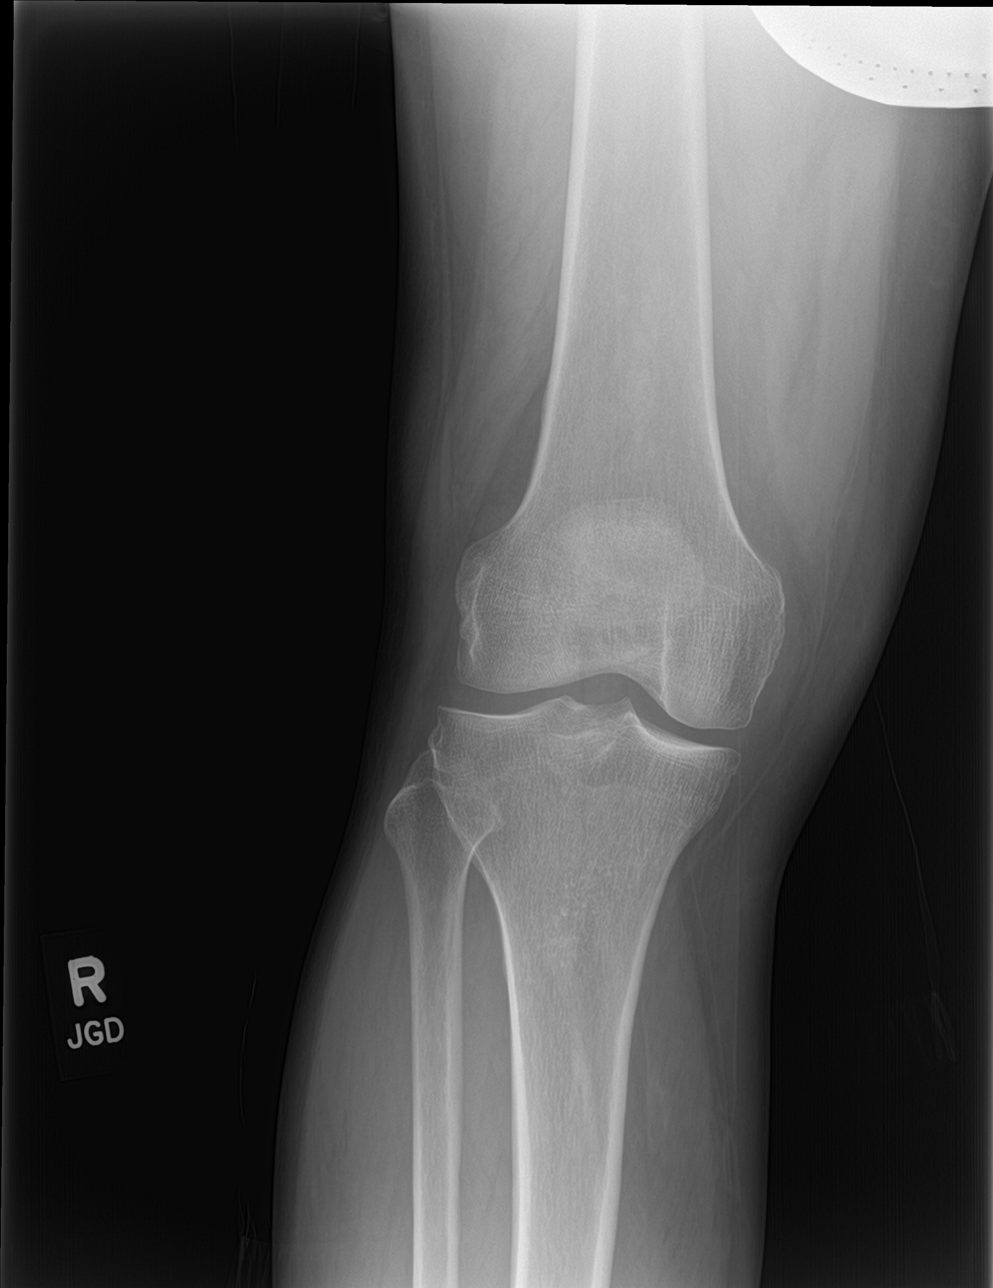

[4 of 4 positions shown; findings below may reference images not displayed]

FINDINGS: No evidence of fracture, dislocation, or joint effusion. No evidence
of arthropathy or other focal bone abnormality. Soft tissues are
unremarkable.
IMPRESSION: Negative.

## 2022-12-30 ENCOUNTER — Other Ambulatory Visit: Payer: Self-pay | Admitting: Student

## 2022-12-30 DIAGNOSIS — F411 Generalized anxiety disorder: Secondary | ICD-10-CM

## 2023-01-02 ENCOUNTER — Other Ambulatory Visit: Payer: Self-pay

## 2023-01-02 DIAGNOSIS — F411 Generalized anxiety disorder: Secondary | ICD-10-CM

## 2023-01-02 MED ORDER — HYDROXYZINE HCL 25 MG PO TABS: 25.0000 mg | ORAL_TABLET | Freq: Two times a day (BID) | ORAL | 1 refills | Status: DC | PRN

## 2023-01-02 NOTE — Telephone Encounter (Signed)
[facility] pharmacy requesting refill on hydrOXYzine (ATARAX) 25 MG tablet.

## 2023-02-10 ENCOUNTER — Other Ambulatory Visit: Payer: Self-pay

## 2023-02-10 DIAGNOSIS — F330 Major depressive disorder, recurrent, mild: Secondary | ICD-10-CM

## 2023-02-10 DIAGNOSIS — F411 Generalized anxiety disorder: Secondary | ICD-10-CM

## 2023-02-10 MED ORDER — SERTRALINE HCL 100 MG PO TABS: ORAL_TABLET | ORAL | 1 refills | Status: DC

## 2023-04-14 ENCOUNTER — Other Ambulatory Visit: Payer: Self-pay | Admitting: Student

## 2023-04-14 DIAGNOSIS — M109 Gout, unspecified: Secondary | ICD-10-CM

## 2023-04-19 ENCOUNTER — Other Ambulatory Visit: Payer: Self-pay | Admitting: Student

## 2023-04-19 DIAGNOSIS — F330 Major depressive disorder, recurrent, mild: Secondary | ICD-10-CM

## 2023-04-19 DIAGNOSIS — F411 Generalized anxiety disorder: Secondary | ICD-10-CM

## 2023-05-13 ENCOUNTER — Encounter: Payer: Medicaid Other | Admitting: Student

## 2023-05-13 ENCOUNTER — Other Ambulatory Visit: Payer: Self-pay

## 2023-05-13 DIAGNOSIS — I10 Essential (primary) hypertension: Secondary | ICD-10-CM

## 2023-05-13 MED ORDER — METOPROLOL TARTRATE 50 MG PO TABS
ORAL_TABLET | ORAL | 1 refills | Status: DC
Start: 2023-05-13 — End: 2023-09-11

## 2023-05-13 NOTE — Telephone Encounter (Signed)
Pt is requesting his :   metoprolol tartrate (LOPRESSOR) 50 MG tablet      Sent to     [id number] - [place], [place] - [address] AT [facility] OF [address]

## 2023-06-11 ENCOUNTER — Other Ambulatory Visit: Payer: Self-pay | Admitting: Student

## 2023-06-11 DIAGNOSIS — M109 Gout, unspecified: Secondary | ICD-10-CM

## 2023-07-14 ENCOUNTER — Telehealth: Payer: Self-pay

## 2023-07-14 DIAGNOSIS — F411 Generalized anxiety disorder: Secondary | ICD-10-CM

## 2023-07-14 DIAGNOSIS — F330 Major depressive disorder, recurrent, mild: Secondary | ICD-10-CM

## 2023-07-14 NOTE — Telephone Encounter (Signed)
Received rx refill request for Sertraline from pharmacy, called and lvm for patient to give [place] a call back to schedule an appointment so he can continue to get his medications refilled.

## 2023-08-22 ENCOUNTER — Other Ambulatory Visit: Payer: Self-pay | Admitting: Student

## 2023-08-22 DIAGNOSIS — F330 Major depressive disorder, recurrent, mild: Secondary | ICD-10-CM

## 2023-08-22 DIAGNOSIS — F411 Generalized anxiety disorder: Secondary | ICD-10-CM

## 2023-09-11 ENCOUNTER — Encounter: Payer: Medicaid Other | Admitting: Student

## 2023-09-11 ENCOUNTER — Ambulatory Visit: Payer: Medicaid Other | Admitting: Student

## 2023-09-11 VITALS — BP 171/91 | HR 85 | Temp 98.6°F | Ht 72.0 in | Wt 214.6 lb

## 2023-09-11 DIAGNOSIS — F330 Major depressive disorder, recurrent, mild: Secondary | ICD-10-CM

## 2023-09-11 DIAGNOSIS — M109 Gout, unspecified: Secondary | ICD-10-CM

## 2023-09-11 DIAGNOSIS — I10 Essential (primary) hypertension: Secondary | ICD-10-CM

## 2023-09-11 DIAGNOSIS — F411 Generalized anxiety disorder: Secondary | ICD-10-CM | POA: Diagnosis not present

## 2023-09-11 MED ORDER — OLMESARTAN MEDOXOMIL-HCTZ 20-12.5 MG PO TABS: 1.0000 | ORAL_TABLET | Freq: Every day | ORAL | 3 refills | Status: DC

## 2023-09-11 MED ORDER — LOSARTAN POTASSIUM 50 MG PO TABS: ORAL_TABLET | ORAL | 6 refills | Status: DC

## 2023-09-11 MED ORDER — SERTRALINE HCL 100 MG PO TABS
100.0000 mg | ORAL_TABLET | Freq: Every day | ORAL | 3 refills | Status: DC
Start: 2023-09-11 — End: 2023-11-17

## 2023-09-11 MED ORDER — AMLODIPINE BESYLATE 10 MG PO TABS
10.0000 mg | ORAL_TABLET | Freq: Every day | ORAL | 3 refills | Status: DC
Start: 2023-09-11 — End: 2023-11-17

## 2023-09-11 MED ORDER — METOPROLOL TARTRATE 50 MG PO TABS
ORAL_TABLET | ORAL | 1 refills | Status: DC
Start: 2023-09-11 — End: 2023-09-11

## 2023-09-11 NOTE — Progress Notes (Signed)
   CC: Routine Follow Up   HPI:  Lucas Morse is a 47 y.o. male with pertinent PMH of hypertension, GAD/MDD, and gout who presents to the clinic for routine follow-up visit after being seen 11/14/2022. Please see assessment and plan below for further details.  Past Medical History:  Diagnosis Date   Essential hypertension    Gout     Current Outpatient Medications  Medication Instructions   allopurinol (ZYLOPRIM) 100 MG tablet TAKE 4 TABLETS(400 MG) BY MOUTH DAILY   amLODipine (NORVASC) 10 mg, Oral, Daily   hydrOXYzine (ATARAX) 25 MG tablet TAKE 1 TABLET(25 MG) BY MOUTH TWICE DAILY AS NEEDED   olmesartan-hydrochlorothiazide (BENICAR HCT) 20-12.5 MG tablet 1 tablet, Oral, Daily   predniSONE (DELTASONE) 20 MG tablet TAKE 1 TABLET BY MOUTH DAILY WITH BREAKFAST. TAKE 20 MG TWICE DAILY FOR 3 DAYS. THEN, TAPER BY 10 MG DAILY.   sertraline (ZOLOFT) 100 mg, Oral, Daily, TAKE 1/2 TABLET BY MOUTH FOR 4 DAYS THEN 1 TABLET BY MOUTH DAILY     Review of Systems:   Pertinent items noted in HPI and/or A&P.  Physical Exam:  Vitals:   09/11/23 1504 09/11/23 1538  BP: (!) 166/94 (!) 171/91  Pulse: 88 85  Temp: 98.6 F (37 C)   TempSrc: Oral   SpO2: 99%   Weight: 214 lb 9.6 oz (97.3 kg)   Height: 6' (1.829 m)     Constitutional: Well-appearing adult male. In no acute distress. HEENT: Normocephalic, atraumatic, Sclera non-icteric, PERRL, EOM intact Cardio:Regular rate and rhythm. 2+ bilateral radial pulses. Pulm:Clear to auscultation bilaterally. Normal work of breathing on room air. Abdomen: Soft, non-tender, non-distended, positive bowel sounds. UJW:[id number] for extremity edema. Skin:Warm and dry. Neuro:Alert and oriented x3. No focal deficit noted. Psych:Pleasant mood and affect.   Assessment & Plan:   Essential hypertension Blood pressure elevated at 171/91 on recheck from 166/94.  He has been out of losartan for about a week and out of metoprolol for longer.  No significant  adverse effects on his medications but he just did not have the money to fill all of them.  He has been filling the amlodipine 10 mg daily. - Continue amlodipine 10 mg daily - Discontinue metoprolol and losartan - Start olmesartan/HCTZ 20/12.5 mg daily - BMP today - I will follow-up over the phone for blood pressure log and he will return in 3 to 4 months otherwise  Gout Gout symptoms are well-controlled on allopurinol 400 mg daily and he has prednisone if he does develop a flare.  Uric acid 6.1 on 11/14/2022. - Continue allopurinol 40 mg daily and repeat uric acid today  Generalized anxiety disorder Patient continues to do well on sertraline 100 mg daily and hydroxyzine 25 mg twice daily as needed.  He does not want to make any changes at this time. - Continue sertraline 100 mg daily and hydroxyzine 25 mg daily twice daily    Patient discussed with Dr. Alfonzo Beers, DO [facility] Internal Medicine Resident PGY-2 Clinic Phone: [phone] Pager: [phone]

## 2023-09-11 NOTE — Patient Instructions (Addendum)
  Thank you, Mr.Cailan Bevel, for allowing [place] to provide your care today. Today we discussed . . .  > Blood Pressure       - I would like you to continue taking your amlodipine 10 mg daily and we are adding a combination pill olmesartan/hydrochlorothiazide 20/12.5 mg daliy to replace the losartan and metoprolol. I would like you to pick up a blood pressure cuff from the pharmacy and check your blood pressure once or twice a day and write down these numbers.  I will plan to call you in 2 to 3 weeks to go over these and see if we need to make any adjustments. > Health maintenance       -Please continue to consider getting the flu shot once your cough is improved.  Also think about the colonoscopy and let us know if you have any questions about this.   I have ordered the following labs for you:   Lab Orders         BMP8+Anion Gap         Uric acid        Follow up: 3-4 months    Remember:     Should you have any questions or concerns please call the internal medicine clinic at [phone].     Rocky Morel, DO [facility]

## 2023-09-12 LAB — BMP8+ANION GAP
Anion Gap: 20 mmol/L — ABNORMAL HIGH (ref 10.0–18.0)
BUN/Creatinine Ratio: 18 (ref 9–20)
BUN: 15 mg/dL (ref 6–24)
CO2: 18 mmol/L — ABNORMAL LOW (ref 20–29)
Calcium: 9.9 mg/dL (ref 8.7–10.2)
Chloride: 99 mmol/L (ref 96–106)
Creatinine, Ser: 0.83 mg/dL (ref 0.76–1.27)
Glucose: 94 mg/dL (ref 70–99)
Potassium: 4 mmol/L (ref 3.5–5.2)
Sodium: 137 mmol/L (ref 134–144)
eGFR: 108 mL/min/{1.73_m2} (ref >59)

## 2023-09-12 LAB — URIC ACID: Uric Acid: 5.6 mg/dL (ref 3.8–8.4)

## 2023-09-15 NOTE — Assessment & Plan Note (Signed)
Patient continues to do well on sertraline 100 mg daily and hydroxyzine 25 mg twice daily as needed.  He does not want to make any changes at this time. - Continue sertraline 100 mg daily and hydroxyzine 25 mg daily twice daily

## 2023-09-15 NOTE — Progress Notes (Signed)
Internal Medicine Clinic Attending  Case discussed with the resident at the time of the visit.  We reviewed the resident's history and exam and pertinent patient test results.  I agree with the assessment, diagnosis, and plan of care documented in the resident's note.  

## 2023-09-15 NOTE — Addendum Note (Signed)
Addended by: Dickie La on: 09/15/2023 02:11 PM   Modules accepted: Level of Service

## 2023-09-15 NOTE — Assessment & Plan Note (Signed)
Gout symptoms are well-controlled on allopurinol 400 mg daily and he has prednisone if he does develop a flare.  Uric acid 6.1 on 11/14/2022. - Continue allopurinol 40 mg daily and repeat uric acid today

## 2023-09-15 NOTE — Assessment & Plan Note (Signed)
Blood pressure elevated at 171/91 on recheck from 166/94.  He has been out of losartan for about a week and out of metoprolol for longer.  No significant adverse effects on his medications but he just did not have the money to fill all of them.  He has been filling the amlodipine 10 mg daily. - Continue amlodipine 10 mg daily - Discontinue metoprolol and losartan - Start olmesartan/HCTZ 20/12.5 mg daily - BMP today - I will follow-up over the phone for blood pressure log and he will return in 3 to 4 months otherwise

## 2023-09-24 ENCOUNTER — Other Ambulatory Visit: Payer: Self-pay | Admitting: Student

## 2023-09-24 DIAGNOSIS — M109 Gout, unspecified: Secondary | ICD-10-CM

## 2023-09-25 NOTE — Progress Notes (Signed)
BMP with low bicarb and elevated anion gap.  Unable to reach the patient over the phone but will attempt again.  Most likely due to bicarb degradation en route to lab facility.

## 2023-10-01 ENCOUNTER — Encounter: Payer: Medicaid Other | Admitting: Student

## 2023-10-01 ENCOUNTER — Encounter: Payer: Self-pay | Admitting: Student

## 2023-10-01 NOTE — Progress Notes (Signed)
Still unable to reach the patient over the phone so results will be mailed to the patient.

## 2023-10-08 ENCOUNTER — Other Ambulatory Visit (HOSPITAL_COMMUNITY): Payer: Self-pay | Admitting: Physician Assistant

## 2023-10-08 DIAGNOSIS — F411 Generalized anxiety disorder: Secondary | ICD-10-CM

## 2023-10-13 ENCOUNTER — Other Ambulatory Visit: Payer: Self-pay | Admitting: Student

## 2023-10-13 ENCOUNTER — Telehealth: Payer: Self-pay

## 2023-10-13 DIAGNOSIS — M109 Gout, unspecified: Secondary | ICD-10-CM

## 2023-10-13 NOTE — Telephone Encounter (Signed)
Pt is requesting  cal back .Lucas Morse He state that he tried to go collect his  ( ALLOPURINOL ) but the pharmacist  told him that Dr Geraldo Pitter called and canceled his meds  .Lucas Morse I tired to call the [facility] but kept getting placed on hold

## 2023-10-13 NOTE — Telephone Encounter (Signed)
Allopurinol rx was sent to [facility] on [address] on 10/10. I talked to Josh at [facility] - stated they did not received rx on 09/25/23 ; our records show it had. Verbal order given to Josh, pharm - "Allopurinol 100 mg TAKE 4 TABLETS(400 MG) BY MOUTH DAILY Dispense: 90 tablet, Refills: 2 ordered " per Dr Geraldo Pitter. Pt called and informed. [facility] on [address] removed from pt's record per his request.

## 2023-11-03 ENCOUNTER — Other Ambulatory Visit: Payer: Self-pay

## 2023-11-03 DIAGNOSIS — M109 Gout, unspecified: Secondary | ICD-10-CM

## 2023-11-03 MED ORDER — PREDNISONE 20 MG PO TABS
20.0000 mg | ORAL_TABLET | Freq: Every day | ORAL | 0 refills | Status: DC
Start: 2023-11-03 — End: 2023-11-17

## 2023-11-04 ENCOUNTER — Other Ambulatory Visit: Payer: Self-pay | Admitting: Student

## 2023-11-04 DIAGNOSIS — F411 Generalized anxiety disorder: Secondary | ICD-10-CM

## 2023-11-17 ENCOUNTER — Other Ambulatory Visit: Payer: Self-pay

## 2023-11-17 DIAGNOSIS — F330 Major depressive disorder, recurrent, mild: Secondary | ICD-10-CM

## 2023-11-17 DIAGNOSIS — M109 Gout, unspecified: Secondary | ICD-10-CM

## 2023-11-17 DIAGNOSIS — F411 Generalized anxiety disorder: Secondary | ICD-10-CM

## 2023-11-17 DIAGNOSIS — I10 Essential (primary) hypertension: Secondary | ICD-10-CM

## 2023-11-17 NOTE — Telephone Encounter (Signed)
Patient called he stated he no longer has insurance and needs all of his medications refilled, he can't afford to pay OOP.

## 2023-11-17 NOTE — Telephone Encounter (Signed)
Return call from pt stating he has no insurance and he needs all his meds refilled today. And he has no money; stated Prednisone cost $30.00 in which he cannot afford. Stated he scared he may have a flare-up. I told the pt [facility] may be an option. And I will send a message to [place] for any assistance and to the doctor.

## 2023-11-18 ENCOUNTER — Other Ambulatory Visit: Payer: Self-pay

## 2023-11-18 ENCOUNTER — Other Ambulatory Visit (HOSPITAL_COMMUNITY): Payer: Self-pay

## 2023-11-18 MED ORDER — OLMESARTAN MEDOXOMIL-HCTZ 20-12.5 MG PO TABS
1.0000 | ORAL_TABLET | Freq: Every day | ORAL | 3 refills | Status: DC
  Filled 2023-11-18: qty 30, 30d supply, fill #0
  Filled 2024-01-07: qty 30, 30d supply, fill #1

## 2023-11-18 MED ORDER — SERTRALINE HCL 100 MG PO TABS
ORAL_TABLET | ORAL | 3 refills | Status: DC
  Filled 2023-11-18: qty 90, 92d supply, fill #0

## 2023-11-18 MED ORDER — ALLOPURINOL 100 MG PO TABS
400.0000 mg | ORAL_TABLET | Freq: Every day | ORAL | 2 refills | Status: DC
  Filled 2023-11-18: qty 120, 30d supply, fill #0

## 2023-11-18 MED ORDER — AMLODIPINE BESYLATE 10 MG PO TABS
10.0000 mg | ORAL_TABLET | Freq: Every day | ORAL | 3 refills | Status: DC
  Filled 2023-11-18: qty 30, 30d supply, fill #0
  Filled 2024-01-07 (×2): qty 30, 30d supply, fill #1

## 2023-11-18 MED ORDER — PREDNISONE 20 MG PO TABS
20.0000 mg | ORAL_TABLET | Freq: Every day | ORAL | 0 refills | Status: DC
  Filled 2023-11-18: qty 40, 40d supply, fill #0

## 2023-11-18 MED ORDER — HYDROXYZINE HCL 25 MG PO TABS
25.0000 mg | ORAL_TABLET | Freq: Two times a day (BID) | ORAL | 0 refills | Status: DC | PRN
  Filled 2023-11-18: qty 60, 30d supply, fill #0

## 2023-11-18 NOTE — Telephone Encounter (Signed)
Pt was called and informed refills sent to [facility]. Also informed of Camille's response.

## 2023-11-19 ENCOUNTER — Other Ambulatory Visit: Payer: Self-pay

## 2024-01-01 ENCOUNTER — Other Ambulatory Visit: Payer: Self-pay

## 2024-01-01 DIAGNOSIS — F411 Generalized anxiety disorder: Secondary | ICD-10-CM

## 2024-01-01 DIAGNOSIS — M109 Gout, unspecified: Secondary | ICD-10-CM

## 2024-01-03 MED ORDER — HYDROXYZINE HCL 25 MG PO TABS
25.0000 mg | ORAL_TABLET | Freq: Two times a day (BID) | ORAL | 0 refills | Status: DC | PRN
  Filled 2024-01-03: qty 60, 30d supply, fill #0

## 2024-01-03 MED ORDER — ALLOPURINOL 100 MG PO TABS
400.0000 mg | ORAL_TABLET | Freq: Every day | ORAL | 2 refills | Status: DC
  Filled 2024-01-03: qty 120, 30d supply, fill #0

## 2024-01-05 ENCOUNTER — Other Ambulatory Visit: Payer: Self-pay

## 2024-01-07 ENCOUNTER — Other Ambulatory Visit: Payer: Self-pay

## 2024-01-14 ENCOUNTER — Encounter: Payer: Medicaid Other | Admitting: Internal Medicine

## 2024-01-19 ENCOUNTER — Other Ambulatory Visit: Payer: Self-pay

## 2024-02-02 ENCOUNTER — Ambulatory Visit: Payer: Self-pay | Admitting: Student

## 2024-02-02 ENCOUNTER — Other Ambulatory Visit: Payer: Self-pay

## 2024-02-02 ENCOUNTER — Other Ambulatory Visit (HOSPITAL_COMMUNITY): Payer: Self-pay

## 2024-02-02 VITALS — BP 166/109 | HR 96 | Ht 72.0 in | Wt 219.6 lb

## 2024-02-02 DIAGNOSIS — F330 Major depressive disorder, recurrent, mild: Secondary | ICD-10-CM

## 2024-02-02 DIAGNOSIS — F32A Depression, unspecified: Secondary | ICD-10-CM

## 2024-02-02 DIAGNOSIS — I10 Essential (primary) hypertension: Secondary | ICD-10-CM

## 2024-02-02 DIAGNOSIS — F325 Major depressive disorder, single episode, in full remission: Secondary | ICD-10-CM

## 2024-02-02 DIAGNOSIS — F411 Generalized anxiety disorder: Secondary | ICD-10-CM

## 2024-02-02 DIAGNOSIS — M109 Gout, unspecified: Secondary | ICD-10-CM

## 2024-02-02 MED ORDER — ALLOPURINOL 200 MG PO TABS
400.0000 mg | ORAL_TABLET | Freq: Every day | ORAL | 11 refills | Status: DC
  Filled 2024-02-02: qty 30, fill #0

## 2024-02-02 MED ORDER — IRBESARTAN 300 MG PO TABS
300.0000 mg | ORAL_TABLET | Freq: Every day | ORAL | 3 refills | Status: DC
  Filled 2024-02-02 – 2024-02-20 (×4): qty 30, 30d supply, fill #0
  Filled 2024-04-08: qty 30, 30d supply, fill #1
  Filled 2024-05-25: qty 30, 30d supply, fill #2
  Filled 2024-08-26 – 2024-10-07 (×3): qty 30, 30d supply, fill #3

## 2024-02-02 MED ORDER — AMLODIPINE BESYLATE 10 MG PO TABS
10.0000 mg | ORAL_TABLET | Freq: Every day | ORAL | 3 refills | Status: AC
  Filled 2024-02-02: qty 30, 30d supply, fill #0
  Filled 2024-02-19 (×2): qty 90, 90d supply, fill #0
  Filled 2024-02-20: qty 30, 30d supply, fill #0
  Filled 2024-04-08: qty 30, 30d supply, fill #1
  Filled 2024-05-25: qty 30, 30d supply, fill #2
  Filled 2024-07-09: qty 30, 30d supply, fill #3
  Filled 2024-08-26 (×2): qty 30, 30d supply, fill #4
  Filled 2024-10-07 (×2): qty 30, 30d supply, fill #5
  Filled 2024-11-19: qty 30, 30d supply, fill #6
  Filled 2025-01-07 (×2): qty 30, 30d supply, fill #7

## 2024-02-02 MED ORDER — HYDROXYZINE HCL 25 MG PO TABS
25.0000 mg | ORAL_TABLET | Freq: Two times a day (BID) | ORAL | 3 refills | Status: DC | PRN
  Filled 2024-02-02 – 2024-02-20 (×4): qty 60, 30d supply, fill #0
  Filled 2024-04-08: qty 60, 30d supply, fill #1
  Filled 2024-05-25: qty 60, 30d supply, fill #2
  Filled 2024-10-07 (×2): qty 60, 30d supply, fill #3

## 2024-02-02 MED ORDER — SERTRALINE HCL 100 MG PO TABS
100.0000 mg | ORAL_TABLET | Freq: Every day | ORAL | 11 refills | Status: AC
  Filled 2024-02-02 – 2024-02-20 (×4): qty 30, 30d supply, fill #0
  Filled 2024-04-08: qty 30, 30d supply, fill #1
  Filled 2024-05-25: qty 30, 30d supply, fill #2
  Filled 2024-08-26 – 2024-10-07 (×3): qty 30, 30d supply, fill #3
  Filled 2024-11-19: qty 30, 30d supply, fill #4
  Filled 2025-01-07 (×2): qty 30, 30d supply, fill #5

## 2024-02-02 MED ORDER — ALLOPURINOL 100 MG PO TABS
400.0000 mg | ORAL_TABLET | Freq: Every day | ORAL | 11 refills | Status: AC
  Filled 2024-02-02 – 2024-02-20 (×4): qty 120, 30d supply, fill #0
  Filled 2024-04-08: qty 120, 30d supply, fill #1
  Filled 2024-05-25: qty 120, 30d supply, fill #2
  Filled 2024-07-09: qty 120, 30d supply, fill #3
  Filled 2024-08-26 (×2): qty 120, 30d supply, fill #4
  Filled 2024-10-07 (×2): qty 120, 30d supply, fill #5
  Filled 2024-11-19: qty 120, 30d supply, fill #6
  Filled 2025-01-07 (×2): qty 120, 30d supply, fill #7

## 2024-02-02 NOTE — Patient Instructions (Signed)
  Thank you, [name], for allowing [place] to provide your care today. Today we discussed . . .  > Blood pressure       -I would like you to get a blood pressure cuff and take your blood pressure 2 times a day at home and write down these values.  We will set up a telehealth visit to discuss your blood pressure in about 3 weeks.  We are going to change up your medication a [place] bit you will continue to take amlodipine 10 mg daily but stop Benicar (olmesartan/hydrochlorothiazide) and replaced this with irbesartan 300 mg daily. > Gout       -Please continue to take allopurinol 400 mg daily.  This medication prevents gout flares and is doing a good job based on your last uric acid level.  Please do not take prednisone unless you have a gout flare.  Prednisone does not work to prevent gout flares but it does work to treat them.  Please remember that there are significant side effects with using prednisone including stomach ulcers that can bleed, thinning bones, adrenal insufficiency that will cause dangerously low blood pressure, and high blood sugar.  If you do have a gout flare please take it as prescribed on the bottle but otherwise do not take the prednisone.  I have ordered the following medication/changed the following medications:   Stop the following medications: Medications Discontinued During This Encounter  Medication Reason   olmesartan-hydrochlorothiazide (BENICAR HCT) 20-12.5 MG tablet    amLODipine (NORVASC) 10 MG tablet Reorder   sertraline (ZOLOFT) 100 MG tablet Reorder   hydrOXYzine (ATARAX) 25 MG tablet Reorder   allopurinol (ZYLOPRIM) 100 MG tablet Reorder     Start the following medications: Meds ordered this encounter  Medications   allopurinol 200 MG TABS    Sig: Take 400 mg by mouth daily.    Dispense:  30 tablet    Refill:  11    IM Program   amLODipine (NORVASC) 10 MG tablet    Sig: Take 1 tablet (10 mg total) by mouth daily.    Dispense:  90 tablet     Refill:  3    IM Program   hydrOXYzine (ATARAX) 25 MG tablet    Sig: Take 1 tablet (25 mg total) by mouth 2 (two) times daily as needed for anxiety.    Dispense:  60 tablet    Refill:  3    IM Program   sertraline (ZOLOFT) 100 MG tablet    Sig: Take 1 tablet (100 mg total) by mouth daily.    Dispense:  30 tablet    Refill:  11    IM Program   irbesartan (AVAPRO) 300 MG tablet    Sig: Take 1 tablet (300 mg total) by mouth daily.    Dispense:  30 tablet    Refill:  3    IM Program      Follow up:  3 weeks for telehealth  4 months for follow up with PCP   Remember:     Should you have any questions or concerns please call the internal medicine clinic at [phone].     Rocky Morel, DO [facility]

## 2024-02-02 NOTE — Assessment & Plan Note (Signed)
 Last uric acid level was 5.6 on 09/11/2023.  He has been stable on allopurinol for 100 mg daily and has prednisone 20 mg for gout flares.  However he has been taking this medication once a week to help prevent gout flares and once for acute back pain.  We had an in-depth discussion about the risks of frequent steroid use and he agrees to only use it if he has a gout flare. - Continue allopurinol 40 mg daily

## 2024-02-02 NOTE — Assessment & Plan Note (Signed)
 Patient has well-controlled mixed anxiety and depression on sertraline 100 mg daily and hydroxyzine 25 mg twice daily as needed.  He has previously been on buspirone but has no desire to change his medications at this time. - Continue sertraline 100 mg daily and hydroxyzine 25 mg twice daily as needed

## 2024-02-02 NOTE — Assessment & Plan Note (Addendum)
 Blood pressure today is elevated at 166/109 after the patient ran out of his blood pressure medications due to losing his health insurance after changing jobs.  He was previously on amlodipine 10 mg daily and olmesartan/hydrochlorothiazide 20-12.5 mg daily.  We will represcribe medications through the IM program in the [facility] health pharmacy but stop his hydrochlorothiazide due to his gout. - Continue amlodipine 10 mg daily, stop Benicar, start irbesartan 300 mg daily - Blood pressure log to be reviewed in 3 weeks at telehealth visit - Hold off on BMP until next visit in 3 to 4 months

## 2024-02-02 NOTE — Progress Notes (Signed)
   CC: Routine Follow Up   HPI:  Lucas Morse is a 48 y.o. male with pertinent PMH of HTN, GAD, and gout who presents to the clinic for follow-up visit after being seen 09/11/2023. Please see assessment and plan below for further details.  Past Medical History:  Diagnosis Date   Essential hypertension    Gout     Current Outpatient Medications  Medication Instructions   allopurinol (ZYLOPRIM) 400 mg, Oral, Daily   amLODipine (NORVASC) 10 mg, Oral, Daily   hydrOXYzine (ATARAX) 25 mg, Oral, 2 times daily PRN   irbesartan (AVAPRO) 300 mg, Oral, Daily   predniSONE (DELTASONE) 20 mg, Oral, Daily with breakfast, Take for 3-5 days at a time.   sertraline (ZOLOFT) 100 mg, Oral, Daily     Review of Systems:   Pertinent items noted in HPI and/or A&P.  Physical Exam:  Vitals:   02/02/24 1438 02/02/24 1506  BP: (!) 165/119 (!) 166/109  Pulse: (!) 101 96  SpO2: 100%   Weight: 219 lb 9.6 oz (99.6 kg)   Height: 6' (1.829 m)     Constitutional: Well-appearing adult male. In no acute distress. HEENT: Normocephalic, atraumatic, Sclera non-icteric, PERRL, EOM intact Cardio:Regular rate and rhythm. 2+ bilateral radial pulses. Pulm:Clear to auscultation bilaterally. Normal work of breathing on room air. Abdomen: Soft, non-tender, non-distended, positive bowel sounds. ZOX:[id number] for extremity edema. Skin:Warm and dry. Neuro:Alert and oriented x3. No focal deficit noted. Psych:Pleasant mood and affect.   Assessment & Plan:   Essential hypertension Blood pressure today is elevated at 166/109 after the patient ran out of his blood pressure medications due to losing his health insurance after changing jobs.  He was previously on amlodipine 10 mg daily and olmesartan/hydrochlorothiazide 20-12.5 mg daily.  We will represcribe medications through the IM program in the [facility] health pharmacy but stop his hydrochlorothiazide due to his gout. - Continue amlodipine 10 mg daily, stop Benicar,  start irbesartan 300 mg daily - Blood pressure log to be reviewed in 3 weeks at telehealth visit - Hold off on BMP until next visit in 3 to 4 months  Gout Last uric acid level was 5.6 on 09/11/2023.  He has been stable on allopurinol for 100 mg daily and has prednisone 20 mg for gout flares.  However he has been taking this medication once a week to help prevent gout flares and once for acute back pain.  We had an in-depth discussion about the risks of frequent steroid use and he agrees to only use it if he has a gout flare. - Continue allopurinol 40 mg daily  Generalized anxiety disorder Patient has well-controlled mixed anxiety and depression on sertraline 100 mg daily and hydroxyzine 25 mg twice daily as needed.  He has previously been on buspirone but has no desire to change his medications at this time. - Continue sertraline 100 mg daily and hydroxyzine 25 mg twice daily as needed    Patient discussed with Dr. Claudie Revering, DO [facility] Internal Medicine Resident PGY-2 Clinic Phone: [phone] Pager: [phone]

## 2024-02-05 NOTE — Progress Notes (Signed)
 Internal Medicine Clinic Attending  I was physically present during the key portions of the resident provided service and participated in the medical decision making of patient's management care. I reviewed pertinent patient test results.  The assessment, diagnosis, and plan were formulated together and I agree with the documentation in the resident's note.  Reymundo Poll, MD

## 2024-02-12 ENCOUNTER — Other Ambulatory Visit (HOSPITAL_COMMUNITY): Payer: Self-pay

## 2024-02-19 ENCOUNTER — Other Ambulatory Visit: Payer: Self-pay

## 2024-02-19 ENCOUNTER — Other Ambulatory Visit (HOSPITAL_COMMUNITY): Payer: Self-pay

## 2024-02-20 ENCOUNTER — Other Ambulatory Visit: Payer: Self-pay

## 2024-04-08 ENCOUNTER — Other Ambulatory Visit: Payer: Self-pay

## 2024-04-08 ENCOUNTER — Other Ambulatory Visit: Payer: Self-pay | Admitting: Student

## 2024-04-08 DIAGNOSIS — M109 Gout, unspecified: Secondary | ICD-10-CM

## 2024-04-09 ENCOUNTER — Other Ambulatory Visit: Payer: Self-pay

## 2024-04-09 MED ORDER — PREDNISONE 20 MG PO TABS
20.0000 mg | ORAL_TABLET | Freq: Every day | ORAL | 0 refills | Status: AC
  Filled 2024-04-09: qty 40, 40d supply, fill #0

## 2024-05-25 ENCOUNTER — Other Ambulatory Visit: Payer: Self-pay | Admitting: Student

## 2024-05-25 ENCOUNTER — Other Ambulatory Visit: Payer: Self-pay

## 2024-05-25 DIAGNOSIS — M109 Gout, unspecified: Secondary | ICD-10-CM

## 2024-05-26 ENCOUNTER — Other Ambulatory Visit: Payer: Self-pay

## 2024-06-09 ENCOUNTER — Other Ambulatory Visit: Payer: Self-pay

## 2024-06-17 ENCOUNTER — Encounter: Admitting: Student

## 2024-07-09 ENCOUNTER — Other Ambulatory Visit: Payer: Self-pay

## 2024-07-12 ENCOUNTER — Other Ambulatory Visit: Payer: Self-pay

## 2024-07-16 ENCOUNTER — Other Ambulatory Visit (HOSPITAL_COMMUNITY): Payer: Self-pay

## 2024-08-26 ENCOUNTER — Other Ambulatory Visit: Payer: Self-pay

## 2024-08-26 ENCOUNTER — Other Ambulatory Visit (HOSPITAL_COMMUNITY): Payer: Self-pay

## 2024-09-02 ENCOUNTER — Telehealth: Payer: Self-pay

## 2024-09-02 NOTE — Telephone Encounter (Signed)
 Patient last seen 2/17/ 25 I called the patient to schedule a fu appointment. Unable to reach the patient, I lvm for him to give us  a call back.

## 2024-10-07 ENCOUNTER — Other Ambulatory Visit: Payer: Self-pay

## 2024-10-07 ENCOUNTER — Other Ambulatory Visit (HOSPITAL_COMMUNITY): Payer: Self-pay

## 2024-11-19 ENCOUNTER — Other Ambulatory Visit: Payer: Self-pay | Admitting: Student

## 2024-11-19 ENCOUNTER — Other Ambulatory Visit: Payer: Self-pay

## 2024-11-19 DIAGNOSIS — F411 Generalized anxiety disorder: Secondary | ICD-10-CM

## 2024-11-19 DIAGNOSIS — I10 Essential (primary) hypertension: Secondary | ICD-10-CM

## 2024-11-19 MED ORDER — HYDROXYZINE HCL 25 MG PO TABS
25.0000 mg | ORAL_TABLET | Freq: Two times a day (BID) | ORAL | 3 refills | Status: AC | PRN
  Filled 2024-11-19: qty 60, 30d supply, fill #0
  Filled 2025-01-07 (×2): qty 60, 30d supply, fill #1

## 2024-11-19 MED ORDER — IRBESARTAN 300 MG PO TABS
300.0000 mg | ORAL_TABLET | Freq: Every day | ORAL | 3 refills | Status: AC
  Filled 2024-11-19 – 2025-01-07 (×4): qty 30, 30d supply, fill #0

## 2024-11-22 ENCOUNTER — Other Ambulatory Visit: Payer: Self-pay

## 2024-11-23 ENCOUNTER — Other Ambulatory Visit: Payer: Self-pay

## 2025-01-07 ENCOUNTER — Other Ambulatory Visit (HOSPITAL_COMMUNITY): Payer: Self-pay

## 2025-01-07 ENCOUNTER — Other Ambulatory Visit: Payer: Self-pay

## 2025-01-10 ENCOUNTER — Other Ambulatory Visit: Payer: Self-pay
